# Patient Record
Sex: Female | Born: 1975 | Hispanic: No | Smoking: Never smoker
Health system: Southern US, Community
[De-identification: ages and names within clinical notes are randomized; demographics above are authoritative.]

## PROBLEM LIST (undated history)

## (undated) DIAGNOSIS — M1990 Unspecified osteoarthritis, unspecified site: Secondary | ICD-10-CM

## (undated) DIAGNOSIS — D649 Anemia, unspecified: Secondary | ICD-10-CM

## (undated) DIAGNOSIS — A630 Anogenital (venereal) warts: Secondary | ICD-10-CM

## (undated) DIAGNOSIS — L930 Discoid lupus erythematosus: Secondary | ICD-10-CM

## (undated) DIAGNOSIS — G43909 Migraine, unspecified, not intractable, without status migrainosus: Secondary | ICD-10-CM

## (undated) DIAGNOSIS — E739 Lactose intolerance, unspecified: Secondary | ICD-10-CM

## (undated) HISTORY — PX: OTHER SURGICAL HISTORY: SHX169

## (undated) HISTORY — DX: Migraine, unspecified, not intractable, without status migrainosus: G43.909

## (undated) HISTORY — DX: Anogenital (venereal) warts: A63.0

## (undated) HISTORY — DX: Discoid lupus erythematosus: L93.0

## (undated) HISTORY — DX: Lactose intolerance, unspecified: E73.9

## (undated) HISTORY — PX: FOOT SURGERY: SHX648

---

## 2003-05-03 ENCOUNTER — Other Ambulatory Visit: Admission: RE | Admit: 2003-05-03 | Discharge: 2003-05-03 | Payer: Self-pay | Admitting: Obstetrics and Gynecology

## 2004-05-22 ENCOUNTER — Other Ambulatory Visit: Admission: RE | Admit: 2004-05-22 | Discharge: 2004-05-22 | Payer: Self-pay | Admitting: Obstetrics and Gynecology

## 2005-06-16 ENCOUNTER — Other Ambulatory Visit: Admission: RE | Admit: 2005-06-16 | Discharge: 2005-06-16 | Payer: Self-pay | Admitting: *Deleted

## 2005-11-23 ENCOUNTER — Ambulatory Visit: Payer: Self-pay | Admitting: Family Medicine

## 2005-11-26 ENCOUNTER — Ambulatory Visit: Payer: Self-pay | Admitting: Family Medicine

## 2006-07-20 ENCOUNTER — Other Ambulatory Visit: Admission: RE | Admit: 2006-07-20 | Discharge: 2006-07-20 | Payer: Self-pay | Admitting: Obstetrics & Gynecology

## 2007-09-01 ENCOUNTER — Other Ambulatory Visit: Admission: RE | Admit: 2007-09-01 | Discharge: 2007-09-01 | Payer: Self-pay | Admitting: Obstetrics & Gynecology

## 2013-10-17 ENCOUNTER — Other Ambulatory Visit: Payer: Self-pay | Admitting: Certified Nurse Midwife

## 2013-10-17 NOTE — Telephone Encounter (Signed)
AEX scheduled for 12/05/13 #28/1 refill sent to last patient until AEX

## 2013-12-05 ENCOUNTER — Ambulatory Visit: Payer: Self-pay | Admitting: Certified Nurse Midwife

## 2013-12-12 ENCOUNTER — Encounter: Payer: Self-pay | Admitting: Certified Nurse Midwife

## 2013-12-14 ENCOUNTER — Ambulatory Visit (INDEPENDENT_AMBULATORY_CARE_PROVIDER_SITE_OTHER): Payer: BC Managed Care – PPO | Admitting: Certified Nurse Midwife

## 2013-12-14 ENCOUNTER — Encounter: Payer: Self-pay | Admitting: Certified Nurse Midwife

## 2013-12-14 VITALS — BP 118/70 | HR 74 | Resp 16 | Ht 65.75 in | Wt 130.0 lb

## 2013-12-14 DIAGNOSIS — Z0000 Encounter for general adult medical examination without abnormal findings: Secondary | ICD-10-CM

## 2013-12-14 DIAGNOSIS — A630 Anogenital (venereal) warts: Secondary | ICD-10-CM

## 2013-12-14 DIAGNOSIS — Z309 Encounter for contraceptive management, unspecified: Secondary | ICD-10-CM

## 2013-12-14 DIAGNOSIS — L930 Discoid lupus erythematosus: Secondary | ICD-10-CM | POA: Insufficient documentation

## 2013-12-14 DIAGNOSIS — Z01419 Encounter for gynecological examination (general) (routine) without abnormal findings: Secondary | ICD-10-CM

## 2013-12-14 LAB — TSH: TSH: 2.343 u[IU]/mL (ref 0.350–4.500)

## 2013-12-14 LAB — CBC
HEMATOCRIT: 39.1 % (ref 36.0–46.0)
Hemoglobin: 13.1 g/dL (ref 12.0–15.0)
MCH: 31.2 pg (ref 26.0–34.0)
MCHC: 33.5 g/dL (ref 30.0–36.0)
MCV: 93.1 fL (ref 78.0–100.0)
Platelets: 363 10*3/uL (ref 150–400)
RBC: 4.2 MIL/uL (ref 3.87–5.11)
RDW: 12.9 % (ref 11.5–15.5)
WBC: 9 10*3/uL (ref 4.0–10.5)

## 2013-12-14 LAB — LIPID PANEL
CHOL/HDL RATIO: 2.7 ratio
Cholesterol: 226 mg/dL — ABNORMAL HIGH (ref 0–200)
HDL: 83 mg/dL (ref >39)
LDL Cholesterol: 107 mg/dL — ABNORMAL HIGH (ref 0–99)
TRIGLYCERIDES: 181 mg/dL — AB (ref <150)
VLDL: 36 mg/dL (ref 0–40)

## 2013-12-14 MED ORDER — ETONOGESTREL-ETHINYL ESTRADIOL 0.12-0.015 MG/24HR VA RING: 1.0000 | VAGINAL_RING | Freq: Once | VAGINAL | Status: DC

## 2013-12-14 NOTE — Progress Notes (Signed)
38 y.o. G0P0000 Divorced Caucasian Fe here for annual exam. Periods heavier on 2-3 rd day to the point of soaking clothes and changing pad frequently and then decreases to normal amount the next 2 days. Patient also now has slight spotting mid cycle for 2-4 days, no tampon needed. Patient on contraception for history of menorrhagia. Lupus stable on medication, no health changes.Not sexually active. Sees specialist once yearly for labs regarding Lupus.  Patient's last menstrual period was 11/28/2013.          Sexually active: no  The current method of family planning is OCP (estrogen/progesterone).    Exercising: yes  walking Smoker:  no  Health Maintenance: Pap:  11-25-12 neg HPV HR neg MMG:  none Colonoscopy:  none BMD:   none TDaP:  2007 Labs: none Self breast exam: done monthly   reports that she has never smoked. She does not have any smokeless tobacco history on file. She reports that she does not drink alcohol or use illicit drugs.  Past Medical History  Diagnosis Date  . Genital warts   . Discoid lupus   . Migraines     no aura    Past Surgical History  Procedure Laterality Date  . Tear duct surgery      infant    Current Outpatient Prescriptions  Medication Sig Dispense Refill  . Cholecalciferol (VITAMIN D PO) Take by mouth daily.      . Cyanocobalamin (VITAMIN B 12 PO) Take by mouth daily.      . hydroxychloroquine (PLAQUENIL) 200 MG tablet Take by mouth daily.      . Multiple Vitamins-Minerals (MULTIVITAMIN PO) Take by mouth daily.      . TRI-SPRINTEC 0.18/0.215/0.25 MG-35 MCG tablet Take one tablet by mouth one time daily  28 tablet  1   No current facility-administered medications for this visit.    Family History  Problem Relation Age of Onset  . Lupus Mother   . Hypertension Mother   . Hypertension Maternal Grandmother   . Hypertension Maternal Grandfather   . Cancer Maternal Grandfather     prostate    ROS:  Pertinent items are noted in HPI.   Otherwise, a comprehensive ROS was negative.  Exam:   BP 118/70  Pulse 74  Resp 16  Ht 5' 5.75" (1.67 m)  Wt 130 lb (58.968 kg)  BMI 21.14 kg/m2  LMP 11/28/2013 Height: 5' 5.75" (167 cm)  Ht Readings from Last 3 Encounters:  12/14/13 5' 5.75" (1.67 m)    General appearance: alert, cooperative and appears stated age Head: Normocephalic, without obvious abnormality, atraumatic Neck: no adenopathy, supple, symmetrical, trachea midline and thyroid normal to inspection and palpation Lungs: clear to auscultation bilaterally Breasts: normal appearance, no masses or tenderness, No nipple retraction or dimpling, No nipple discharge or bleeding, No axillary or supraclavicular adenopathy Heart: regular rate and rhythm Abdomen: soft, non-tender; no masses,  no organomegaly Extremities: extremities normal, atraumatic, no cyanosis or edema Skin: Skin color, texture, turgor normal. No rashes or lesions Lymph nodes: Cervical, supraclavicular, and axillary nodes normal. No abnormal inguinal nodes palpated Neurologic: Grossly normal   Pelvic: External genitalia:  no lesions              Urethra:  normal appearing urethra with no masses, tenderness or lesions              Bartholin's and Skene's: normal                 Vagina:  normal appearing vagina with normal color and discharge, no lesions              Cervix: normal, non tender              Pap taken: no Bimanual Exam:  Uterus:  normal size, contour, position, consistency, mobility, non-tender and anteflexed              Adnexa: normal adnexa and no mass, fullness, tenderness               Rectovaginal: Confirms               Anus:  normal sphincter tone, small condyloma noted below fourchette on left perineal area. No other lesions noted.  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 Patient desires trial of Nuvaring. Patient inserted and removed Nuvaring without problems with Provider check.  RX Nuvaring see order Patient to keep menses record and advise after 2 months of Nuvaring use unless problems.  Discussed finding with patient. Patient requests treated with TCA again today.  Procedure: Unaffected area of perineum protected with KY jelly and TCA applied to condyloma with acetowhite effect noted. Patient tolerated well. Patient to check at home to see if resolves in 2 weeks, if not present no visit needed, if still present schedule for retreatment. Patient agreeable.  Continue follow up as indicated.  Lab: TSH,Lipid panel, CBC   Pap smear as per guidelines   pap smear not taken today counseled on STD prevention, use and side effects of OCP's, adequate intake of calcium and vitamin D, diet and exercise  return annually or prn  An After Visit Summary was printed and given to the patient.

## 2013-12-14 NOTE — Patient Instructions (Signed)
EXERCISE AND DIET:  We recommended that you start or continue a regular exercise program for good health. Regular exercise means any activity that makes your heart beat faster and makes you sweat.  We recommend exercising at least 30 minutes per day at least 3 days a week, preferably 4 or 5.  We also recommend a diet low in fat and sugar.  Inactivity, poor dietary choices and obesity can cause diabetes, heart attack, stroke, and kidney damage, among others.    ALCOHOL AND SMOKING:  Women should limit their alcohol intake to no more than 7 drinks/beers/glasses of wine (combined, not each!) per week. Moderation of alcohol intake to this level decreases your risk of breast cancer and liver damage. And of course, no recreational drugs are part of a healthy lifestyle.  And absolutely no smoking or even second hand smoke. Most people know smoking can cause heart and lung diseases, but did you know it also contributes to weakening of your bones? Aging of your skin?  Yellowing of your teeth and nails?  CALCIUM AND VITAMIN D:  Adequate intake of calcium and Vitamin D are recommended.  The recommendations for exact amounts of these supplements seem to change often, but generally speaking 600 mg of calcium (either carbonate or citrate) and 800 units of Vitamin D per day seems prudent. Certain women may benefit from higher intake of Vitamin D.  If you are among these women, your doctor will have told you during your visit.    PAP SMEARS:  Pap smears, to check for cervical cancer or precancers,  have traditionally been done yearly, although recent scientific advances have shown that most women can have pap smears less often.  However, every woman still should have a physical exam from her gynecologist every year. It will include a breast check, inspection of the vulva and vagina to check for abnormal growths or skin changes, a visual exam of the cervix, and then an exam to evaluate the size and shape of the uterus and  ovaries.  And after 40 years of age, a rectal exam is indicated to check for rectal cancers. We will also provide age appropriate advice regarding health maintenance, like when you should have certain vaccines, screening for sexually transmitted diseases, bone density testing, colonoscopy, mammograms, etc.   MAMMOGRAMS:  All women over 40 years old should have a yearly mammogram. Many facilities now offer a "3D" mammogram, which may cost around $50 extra out of pocket. If possible,  we recommend you accept the option to have the 3D mammogram performed.  It both reduces the number of women who will be called back for extra views which then turn out to be normal, and it is better than the routine mammogram at detecting truly abnormal areas.    COLONOSCOPY:  Colonoscopy to screen for colon cancer is recommended for all women at age 50.  We know, you hate the idea of the prep.  We agree, BUT, having colon cancer and not knowing it is worse!!  Colon cancer so often starts as a polyp that can be seen and removed at colonscopy, which can quite literally save your life!  And if your first colonoscopy is normal and you have no family history of colon cancer, most women don't have to have it again for 10 years.  Once every ten years, you can do something that may end up saving your life, right?  We will be happy to help you get it scheduled when you are ready.    Be sure to check your insurance coverage so you understand how much it will cost.  It may be covered as a preventative service at no cost, but you should check your particular policy.     Genital Warts Genital warts are caused by a germ (human papillomavirus, HPV). This germ is spread by having unprotected sex (intercourse) with an infected person. It can be spread by vaginal, anal, and oral sex. A person who is infected might not show any signs or problems. HOME CARE  Follow your doctor's treatment instructions.  Do not use medicine that is meant for hand  warts. Only take medicine as told by your doctor.  Tell your past and current sex partner(s) that you have genital warts. They may need treatment.  Avoid sexual contact while you are being treated.  Do not touch or scratch the warts. You could spread it to other parts of your body.  Women with genital warts should have a cervical cancer check (Pap test) at least once a year.  Tell your doctor if you become pregnant. The germ can be passed to the baby.  After treatment, use a condom during sex.  Ask your doctor before using anti-itch creams. GET HELP RIGHT AWAY IF:   Your treated skin becomes red, puffy (swollen), or painful.  You have a fever.  You feel generally sick.  You feel little lumps in and around your genital area.  You are bleeding or have pain during sex. MAKE SURE YOU:   Understand these instructions.  Will watch your condition.  Will get help right away if you are not doing well or get worse. Document Released: 01/27/2010 Document Revised: 01/25/2012 Document Reviewed: 05/11/2011 [facility] Patient Information 2014 [place], [place].

## 2013-12-14 NOTE — Progress Notes (Signed)
Reviewed personally.  M. Suzanne Jazline Cumbee, MD.  

## 2013-12-19 ENCOUNTER — Telehealth: Payer: Self-pay

## 2013-12-19 NOTE — Telephone Encounter (Signed)
[email or web address]

## 2013-12-19 NOTE — Telephone Encounter (Signed)
Message copied by Susy Manor on Tue Dec 19, 2013  3:47 PM ------      Message from: Regina Eck      Created: Fri Dec 15, 2013  5:08 PM       Notify patient that lipid panel borderline high, but HDL good cholesterol is great      Regular exercise and low cholesterol diet should  Help this stay normal      CBC and TSH normal ------

## 2013-12-19 NOTE — Telephone Encounter (Signed)
Patient notified of results.

## 2014-03-22 ENCOUNTER — Telehealth: Payer: Self-pay | Admitting: Certified Nurse Midwife

## 2014-03-22 NOTE — Telephone Encounter (Signed)
Spoke with patient. Patient states that she is calling to give Regina Eck CNM an update on how she is doing with switching over to the [place]. Patient states "Things are clearing up and seem to be better. My only concern is with my mood swings. They are more often than usual. They come a day or two before my period and I am really irritable. I have had mood swings before but not to this extreme." Patient would like to know if this is something that will get worse as she continues to take this medication or if it will eventually go away. Advised this could be a result of the body adjusting to new medication. Advised the longer she uses the Nuvaring we would not expect symptoms to increase monthly but we can not guarantee that they will decrease with time as all medications effects people differently. Patient states that she understands and would like to continue on the [place] as it is helping a lot other than mood swings. Will continue to monitor irritability and mood swings and will call back with update. If patient changes her mind or has any further questions she will give [place] a call back. Patient agreeable and verbalizes understanding.   Routing to provider for final review. Patient agreeable to disposition. Will close encounter

## 2014-03-22 NOTE — Telephone Encounter (Signed)
Patient is calling to let Debbi know how she is doing on the new medication. She said it is going better but she has a few concerns and wants to discuss them with debbi

## 2014-10-17 ENCOUNTER — Ambulatory Visit (INDEPENDENT_AMBULATORY_CARE_PROVIDER_SITE_OTHER): Payer: BC Managed Care – PPO | Admitting: Podiatry

## 2014-10-17 ENCOUNTER — Encounter: Payer: Self-pay | Admitting: Podiatry

## 2014-10-17 ENCOUNTER — Ambulatory Visit (INDEPENDENT_AMBULATORY_CARE_PROVIDER_SITE_OTHER): Payer: BC Managed Care – PPO

## 2014-10-17 VITALS — BP 133/75 | HR 87 | Resp 16 | Ht 66.0 in | Wt 125.0 lb

## 2014-10-17 DIAGNOSIS — M7751 Other enthesopathy of right foot: Secondary | ICD-10-CM

## 2014-10-17 DIAGNOSIS — M779 Enthesopathy, unspecified: Secondary | ICD-10-CM

## 2014-10-17 DIAGNOSIS — M778 Other enthesopathies, not elsewhere classified: Secondary | ICD-10-CM

## 2014-10-17 DIAGNOSIS — M19171 Post-traumatic osteoarthritis, right ankle and foot: Secondary | ICD-10-CM

## 2014-10-18 NOTE — Progress Notes (Signed)
She presents today with chief complaint of pain to the dorsal aspect of her right foot as she points to the first metatarsophalangeal joint right and a prominence to the dorsal aspect of the first metatarsal medial cuneiform joint. She states that she has been in pain for at least X months now.  Objective: Vital signs are stable she is alert and oriented 3. Pulses are strongly palpable bilateral. She does have pain on palpation and range of motion of the first metatarsophalangeal joint of the right foot which does not appear to be rigid however it does demonstrate a moderate functional hallux limitus. She also has dorsal spurring to the first metatarsal medial cuneiform joint. Radiographic evaluation does demonstrate dorsal spurring and what appears to be a prominent injury at the base of the second metatarsal which has gone on to fuse with the base of the first metatarsal and elevated and elongated first metatarsal is resulting in functional and anatomical hallux limitus with capsulitis.  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  Plan: Injected dexamethasone after sterile Betadine skin prep first metatarsophalangeal joint right foot and she was scanned for set of orthotics.

## 2014-11-26 ENCOUNTER — Ambulatory Visit (INDEPENDENT_AMBULATORY_CARE_PROVIDER_SITE_OTHER): Payer: BLUE CROSS/BLUE SHIELD | Admitting: *Deleted

## 2014-11-26 ENCOUNTER — Ambulatory Visit: Payer: Self-pay | Admitting: Podiatry

## 2014-11-26 DIAGNOSIS — M779 Enthesopathy, unspecified: Secondary | ICD-10-CM

## 2014-11-26 NOTE — Patient Instructions (Signed)

## 2014-11-26 NOTE — Progress Notes (Signed)
Orthotics  Picked up. Breakin instructions given.

## 2014-12-18 ENCOUNTER — Other Ambulatory Visit: Payer: Self-pay | Admitting: Certified Nurse Midwife

## 2014-12-18 NOTE — Telephone Encounter (Signed)
Patient has a AEX scheduled for 12/20/14 with Ms. Debbie. I called and s/w patient she said she doesn't need a refill she can wait until her AEX.

## 2014-12-20 ENCOUNTER — Encounter: Payer: Self-pay | Admitting: Certified Nurse Midwife

## 2014-12-20 ENCOUNTER — Ambulatory Visit (INDEPENDENT_AMBULATORY_CARE_PROVIDER_SITE_OTHER): Payer: BLUE CROSS/BLUE SHIELD | Admitting: Certified Nurse Midwife

## 2014-12-20 VITALS — BP 120/70 | HR 72 | Resp 16 | Ht 65.75 in | Wt 130.0 lb

## 2014-12-20 DIAGNOSIS — Z01419 Encounter for gynecological examination (general) (routine) without abnormal findings: Secondary | ICD-10-CM

## 2014-12-20 DIAGNOSIS — N920 Excessive and frequent menstruation with regular cycle: Secondary | ICD-10-CM

## 2014-12-20 DIAGNOSIS — Z124 Encounter for screening for malignant neoplasm of cervix: Secondary | ICD-10-CM

## 2014-12-20 NOTE — Progress Notes (Signed)
39 y.o. G0P0000 Divorced  Caucasian Fe here for annual exam.  Periods normal, no issues.Contraception was working well up to the past 3 months with minimal dysmenorrhea and menorrhagia, then has starting having 2 days of heavy bleeding with cramping and diarrhea. Would to manage better. Migraines have been less. Sees PCP once yearly for medication management. Lupus stable at present. No other helath issues today.  Patient's last menstrual period was 12/13/2014.          Sexually active: No.  The current method of family planning is NuvaRing vaginal inserts.    Exercising: Yes.    walking Smoker:  no  Health Maintenance: Pap:  11-25-12 neg HPV HR neg MMG:  none Colonoscopy:  none BMD:   none TDaP:  2007 Labs: none Self breast exam: done monthly   reports that she has never smoked. She does not have any smokeless tobacco history on file. She reports that she drinks alcohol. She reports that she does not use illicit drugs.  Past Medical History  Diagnosis Date  . Genital warts   . Discoid lupus   . Migraines     no aura    Past Surgical History  Procedure Laterality Date  . Tear duct surgery      infant    Current Outpatient Prescriptions  Medication Sig Dispense Refill  . Cholecalciferol (VITAMIN D PO) Take by mouth daily.    . Cyanocobalamin (VITAMIN B 12 PO) Take by mouth daily.    Marland Kitchen etonogestrel-ethinyl estradiol (NUVARING) 0.12-0.015 MG/24HR vaginal ring Place 1 each vaginally once. Insert vaginally and leave in place for 3 consecutive weeks, then remove for 1 week. 1 each 12  . hydroxychloroquine (PLAQUENIL) 200 MG tablet Take by mouth every other day.     . Multiple Vitamins-Minerals (MULTIVITAMIN PO) Take by mouth daily.     No current facility-administered medications for this visit.    Family History  Problem Relation Age of Onset  . Lupus Mother   . Hypertension Mother   . Hypertension Maternal Grandmother   . Hypertension Maternal Grandfather   . Cancer  Maternal Grandfather     prostate    ROS:  Pertinent items are noted in HPI.  Otherwise, a comprehensive ROS was negative.  Exam:   BP 120/70 mmHg  Pulse 72  Resp 16  Ht 5' 5.75" (1.67 m)  Wt 130 lb (58.968 kg)  BMI 21.14 kg/m2  LMP 12/13/2014 Height: 5' 5.75" (167 cm) Ht Readings from Last 3 Encounters:  12/20/14 5' 5.75" (1.67 m)  10/17/14 5\' 6"  (1.676 m)  12/14/13 5' 5.75" (1.67 m)    General appearance: alert, cooperative and appears stated age Head: Normocephalic, without obvious abnormality, atraumatic Neck: no adenopathy, supple, symmetrical, trachea midline and thyroid normal to inspection and palpation Lungs: clear to auscultation bilaterally Breasts: normal appearance, no masses or tenderness, No nipple retraction or dimpling, No nipple discharge or bleeding, No axillary or supraclavicular adenopathy Heart: regular rate and rhythm Abdomen: soft, non-tender; no masses,  no organomegaly Extremities: extremities normal, atraumatic, no cyanosis or edema Skin: Skin color, texture, turgor normal. No rashes or lesions Lymph nodes: Cervical, supraclavicular, and axillary nodes normal. No abnormal inguinal nodes palpated Neurologic: Grossly normal   Pelvic: External genitalia:  no lesions              Urethra:  normal appearing urethra with no masses, tenderness or lesions              Bartholin's and Skene's:  normal                 Vagina: normal appearing vagina with normal color and discharge, no lesions Nuvaring present              Cervix: normal,non tender, no lesions              Pap taken: Yes.   Bimanual Exam:  Uterus:  normal size, contour, position, consistency, mobility, non-tender              Adnexa: normal adnexa and no mass, fullness, tenderness               Rectovaginal: Confirms               Anus:  normal sphincter tone, no lesions  Chaperone present: Yes  A:  Well Woman with normal exam  Contraception Nuvaring  desired  Menorrhagia/Dysmennorrhea    P:   Reviewed health and wellness pertinent to exam  Rx Nuvaring see order, discussed IUD use if continues to have periods. Suggested using continuous Nuvaring and period every 3 months to see if resolves. Patient wants to try. Will also check TSH which can effect cycles. Patient will advise if continues with problem.  Pap smear taken today with HPV reflex  counseled on breast self exam, mammography screening given information to schedule her first mammogram in 8/16, adequate intake of calcium and vitamin D, diet and exercise  return annually or prn  An After Visit Summary was printed and given to the patient.

## 2014-12-20 NOTE — Patient Instructions (Signed)

## 2014-12-21 LAB — TSH: TSH: 2.256 u[IU]/mL (ref 0.350–4.500)

## 2014-12-22 ENCOUNTER — Other Ambulatory Visit: Payer: Self-pay | Admitting: Certified Nurse Midwife

## 2014-12-24 ENCOUNTER — Ambulatory Visit: Payer: BLUE CROSS/BLUE SHIELD | Admitting: Podiatry

## 2014-12-24 LAB — IPS PAP TEST WITH REFLEX TO HPV

## 2014-12-24 MED ORDER — ETONOGESTREL-ETHINYL ESTRADIOL 0.12-0.015 MG/24HR VA RING: VAGINAL_RING | VAGINAL | Status: DC

## 2014-12-24 NOTE — Telephone Encounter (Signed)
Floragem 3 is the probiotic and she needs to ask pharmacist for it. It is not RX, but kept in refrigerated case in pharmacy. Nuvaring is what she is to continue with as we discussed. I gave her instructions how to do continuous and period every 3 months.

## 2014-12-24 NOTE — Telephone Encounter (Signed)
Message left to return call to Kathleen Barron at [phone].    

## 2014-12-24 NOTE — Addendum Note (Signed)
Addended by: Regina Eck on: 12/24/2014 01:05 PM   Modules accepted: Miquel Dunn

## 2014-12-24 NOTE — Telephone Encounter (Signed)
Correction to RX for continuous Nuvaring per Debbi's order.

## 2014-12-24 NOTE — Telephone Encounter (Signed)
Debbi, can you review?   I think patient may need a different rx if instructed to use continuously at Visit.   Also, probiotics instructions?

## 2014-12-24 NOTE — Telephone Encounter (Signed)
Patient returned call. Message from Regina Eck CNM given. Advised new rx placed at [facility]. Patient agreeable.  Encounter closed prior.

## 2014-12-24 NOTE — Addendum Note (Signed)
Addended by: Jaymes Graff on: 12/24/2014 01:44 PM   Modules accepted: Orders

## 2014-12-24 NOTE — Telephone Encounter (Signed)
Pt checking on Nuvaring Rx that should have been sent to [facility] pharmacy and also the information on refrigerated probiotics that Ms Jackelyn Poling was going to give her.

## 2014-12-24 NOTE — Progress Notes (Signed)
Reviewed personally.  M. Suzanne Cevin Rubinstein, MD.  

## 2015-02-26 ENCOUNTER — Telehealth: Payer: Self-pay | Admitting: Certified Nurse Midwife

## 2015-02-26 MED ORDER — ETONOGESTREL-ETHINYL ESTRADIOL 0.12-0.015 MG/24HR VA RING: VAGINAL_RING | VAGINAL | Status: DC

## 2015-02-26 NOTE — Telephone Encounter (Signed)
Patient was told to call with a update today. Last seen 12/20/14.

## 2015-02-26 NOTE — Telephone Encounter (Signed)
I feel the continuous use after she has been on another 3 month should stop the spotting, especially since her cycle was better. If she wants to continue can change Rx to continuous use.

## 2015-02-26 NOTE — Telephone Encounter (Signed)
Spoke with patient. Advised of message as seen below from [place]. Patient is agreeable to continue with Nuvaring at this time. Rx for Nuvaring changed to continuous use and sent to [facility] pharmacy on file. Patient is agreeable.  Routing to provider for final review. Patient agreeable to disposition. Will close encounter

## 2015-02-26 NOTE — Telephone Encounter (Signed)
Spoke with patient. Patient is calling to provide update on how she is doing with using Nuvaring continuously. Patient used Nuvaring continuously for three months. Is currently in ring free week. Due to replace new ring today. "I was doing better and then the entire third month I was spotting which was concerning because I could not really tell when my cycle was. Also, for me to use it continuously if she wants me to continue I need the prescription to state that so I can fill it on time every month." Advised patient will provide Regina Eck CNM with an update and return call with further recommendations. Patient is agreeable.

## 2015-02-26 NOTE — Telephone Encounter (Signed)
Left message to call Kaitlyn at [phone]. 

## 2015-04-29 ENCOUNTER — Telehealth: Payer: Self-pay | Admitting: Certified Nurse Midwife

## 2015-04-29 NOTE — Telephone Encounter (Signed)
Have patient go ahead and remove Nuvaring and finish this period and insert Nuvaring on 6/15. Use BUM through rest of month. Wear for 2 month continuous and then remove as usual. If continues spotting needs OV.

## 2015-04-29 NOTE — Telephone Encounter (Signed)
Spoke with patient. Advised of message as seen below from [place]. Patient is agreeable and verbalizes understanding.  Routing to provider for final review. Patient agreeable to disposition. Will close encounter.

## 2015-04-29 NOTE — Telephone Encounter (Signed)
Spoke with patient. Patient states that she was previously on Tri-Sprintec was having very heavy cycles. Switched to the [place] with removal and replacement monthly. With monthly removal and replacement of Nuvaring patient began to have increased spotting was switched to continuous 3 month use. During first three months patient did well until one week before she was due to remove ring and began to have spotting. Was advised to use again for 3 more months as body may be adjusting to using Nuvaring continuously for 3 months. Patient is not on her sixth month and is due to remove her ring on 6/14. Patient started having bleeding in 6/9. "It is worse than last time. Now I have no clue when I will have bleeding. Some days it is light spotting and others its like a normal period. Now I am due to start my period again this week but have been bleeding since last week." Patient requesting further recommendations with Nuvaring. Advised will need to speak with Regina Eck CNM and return call. Patient is agreeable.

## 2015-04-29 NOTE — Telephone Encounter (Signed)
Patient is having some "frustating issues with my period and birth control". Last seen 12/20/2014.

## 2015-08-12 ENCOUNTER — Telehealth: Payer: Self-pay | Admitting: Certified Nurse Midwife

## 2015-08-12 NOTE — Telephone Encounter (Signed)
Patient is having some " issues with her birth control". Lasts seen 12/20/14.

## 2015-08-12 NOTE — Telephone Encounter (Signed)
Spoke with patient. Patient states that Friday 9/23 she began to have increased bleeding and cramping. States she bled through one overnight pad in one hour. "I was having extreme cramping and felt like I was going to throw up. I was having chills and got pale." On Saturday bleeding decreased but was still having large clots. Sunday her bleeding decreased with cramping and diarrhea. Today her bleeding have decreased further has only changed her pad once since waking up. Denies any diarrhea today. Is due to replace her Nuvaring tomorrow. States she has a constant pressure in her left side as well. Friday had intermittent sharp pain in her left side. Denies any sharp pain today. Denies any feelings of fatigue, weakness, dizziness, of shortness of breath. Offered appointment today with another provider but patient declines. Would like see Melvia Heaps CNM tomorrow. Appointment scheduled for 9/27 at 8:30 am. Aware to monitor symptoms and if they increase she will need to be seen earlier.   Routing to provider for final review. Patient agreeable to disposition. Will close encounter.

## 2015-08-13 ENCOUNTER — Ambulatory Visit (INDEPENDENT_AMBULATORY_CARE_PROVIDER_SITE_OTHER): Payer: BLUE CROSS/BLUE SHIELD | Admitting: Certified Nurse Midwife

## 2015-08-13 VITALS — BP 120/70 | HR 64 | Resp 18 | Ht 65.75 in | Wt 127.0 lb

## 2015-08-13 DIAGNOSIS — N920 Excessive and frequent menstruation with regular cycle: Secondary | ICD-10-CM | POA: Diagnosis not present

## 2015-08-13 DIAGNOSIS — N852 Hypertrophy of uterus: Secondary | ICD-10-CM | POA: Diagnosis not present

## 2015-08-13 DIAGNOSIS — N938 Other specified abnormal uterine and vaginal bleeding: Secondary | ICD-10-CM | POA: Diagnosis not present

## 2015-08-13 LAB — CBC
HEMATOCRIT: 38.9 % (ref 36.0–46.0)
Hemoglobin: 13.3 g/dL (ref 12.0–15.0)
MCH: 32 pg (ref 26.0–34.0)
MCHC: 34.2 g/dL (ref 30.0–36.0)
MCV: 93.7 fL (ref 78.0–100.0)
MPV: 10 fL (ref 8.6–12.4)
PLATELETS: 312 10*3/uL (ref 150–400)
RBC: 4.15 MIL/uL (ref 3.87–5.11)
RDW: 13.2 % (ref 11.5–15.5)
WBC: 6.8 10*3/uL (ref 4.0–10.5)

## 2015-08-13 NOTE — Progress Notes (Signed)
39 y.o. Married Caucasian [id number] for evaluation of contraception Nuvaring for the past 2 years. Started on for dysmenorrhea/menorrhagia and contraception. Patient has tried continuous use with 3 month use of Nuvaring which worked well until the last 6 months, then switched to  2  months continous which had been working..  Menses duration 7 days with heavy to moderate flow and then spotting for 6 days.. Patient taking medication as prescribed. Denies missed Nuvaring, headaches, nausea, DVT warning signs.Keeping menses calendar. 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 Feeling OK. Patient does not plan to have any children and is ready to be done with these heavy periods. No other health issues today. Hgb.12.8  O: Healthy female, WD WN Affect: normal orientation X 3   Exam: Skin warm and dry Abdomen: soft, non tender,no masses External genital: normal female,no lesions BUS: normal Urethral meatus normal Vagina: small amount of red blood noted in vagina Cervix: normal appearance,non tender, no lesions Uterus: enlarged 12 week size, non tender Adnexa: normal, non tender, no masses or fullness   A:Normal pelvic exam Enlarged uterus History of dysmenorrhea with menorrhagia with previous good control with Nuvaring monthly and then continuous use, but now having DUB again. Enlarged Uterus Discoid Lupus, no medication now  P: Discussed pelvic exam and enlarged uterus finding and need for evaluation with PUS, which would recommend with DUB occurrence also. Patient agreeable. Patient aware she will be called with insurance info and scheduled. Also discussed possible IUD, ablation for control of menorrhagia, but will not provide contraception. Instructed to go ahead and put Nuvaring to see if spotting will stop and also do Motin 800mg  every 8 hours for 24 hours. Will advise if bleeding does not stop.  Rv prn , as above

## 2015-08-13 NOTE — Patient Instructions (Signed)
Dysfunctional Uterine Bleeding Normally, menstrual periods begin between ages 11 to 17 in young women. A normal menstrual cycle/period may begin every 23 days up to 35 days and lasts from 1 to 7 days. Around 12 to 14 days before your menstrual period starts, ovulation (ovary produces an egg) occurs. When counting the time between menstrual periods, count from the first day of bleeding of the previous period to the first day of bleeding of the next period. Dysfunctional (abnormal) uterine bleeding is bleeding that is different from a normal menstrual period. Your periods may come earlier or later than usual. They may be lighter, have blood clots or be heavier. You may have bleeding between periods, or you may skip one period or more. You may have bleeding after sexual intercourse, bleeding after menopause, or no menstrual period. CAUSES   Pregnancy (normal, miscarriage, tubal).  IUDs (intrauterine device, birth control).  Birth control pills.  Hormone treatment.  Menopause.  Infection of the cervix.  Blood clotting problems.  Infection of the inside lining of the uterus.  Endometriosis, inside lining of the uterus growing in the pelvis and other female organs.  Adhesions (scar tissue) inside the uterus.  Obesity or severe weight loss.  Uterine polyps inside the uterus.  Cancer of the vagina, cervix, or uterus.  Ovarian cysts or polycystic ovary syndrome.  Medical problems (diabetes, thyroid disease).  Uterine fibroids (noncancerous tumor).  Problems with your female hormones.  Endometrial hyperplasia, very thick lining and enlarged cells inside of the uterus.  Medicines that interfere with ovulation.  Radiation to the pelvis or abdomen.  Chemotherapy. DIAGNOSIS   Your doctor will discuss the history of your menstrual periods, medicines you are taking, changes in your weight, stress in your life, and any medical problems you may have.  Your doctor will do a physical  and pelvic examination.  Your doctor may want to perform certain tests to make a diagnosis, such as:  Pap test.  Blood tests.  Cultures for infection.  CT scan.  Ultrasound.  Hysteroscopy.  Laparoscopy.  MRI.  Hysterosalpingography.  D and C.  Endometrial biopsy. TREATMENT  Treatment will depend on the cause of the dysfunctional uterine bleeding (DUB). Treatment may include:  Observing your menstrual periods for a couple of months.  Prescribing medicines for medical problems, including:  Antibiotics.  Hormones.  Birth control pills.  Removing an IUD (intrauterine device, birth control).  Surgery:  D and C (scrape and remove tissue from inside the uterus).  Laparoscopy (examine inside the abdomen with a lighted tube).  Uterine ablation (destroy lining of the uterus with electrical current, laser, heat, or freezing).  Hysteroscopy (examine cervix and uterus with a lighted tube).  Hysterectomy (remove the uterus). HOME CARE INSTRUCTIONS   If medicines were prescribed, take exactly as directed. Do not change or switch medicines without consulting your caregiver.  Long term heavy bleeding may result in iron deficiency. Your caregiver may have prescribed iron pills. They help replace the iron that your body lost from heavy bleeding. Take exactly as directed.  Do not take aspirin or medicines that contain aspirin one week before or during your menstrual period. Aspirin may make the bleeding worse.  If you need to change your sanitary pad or tampon more than once every 2 hours, stay in bed with your feet elevated and a cold pack on your lower abdomen. Rest as much as possible, until the bleeding stops or slows down.  Eat well-balanced meals. Eat foods high in iron. Examples   are:  Leafy green vegetables.  Whole-grain breads and cereals.  Eggs.  Meat.  Liver.  Do not try to lose weight until the abnormal bleeding has stopped and your blood iron level is  back to normal. Do not lift more than ten pounds or do strenuous activities when you are bleeding.  For a couple of months, make note on your calendar, marking the start and ending of your period, and the type of bleeding (light, medium, heavy, spotting, clots or missed periods). This is for your caregiver to better evaluate your problem. SEEK MEDICAL CARE IF:   You develop nausea (feeling sick to your stomach) and vomiting, dizziness, or diarrhea while you are taking your medicine.  You are getting lightheaded or weak.  You have any problems that may be related to the medicine you are taking.  You develop pain with your DUB.  You want to remove your IUD.  You want to stop or change your birth control pills or hormones.  You have any type of abnormal bleeding mentioned above.  You are over 16 years old and have not had a menstrual period yet.  You are 39 years old and you are still having menstrual periods.  You have any of the symptoms mentioned above.  You develop a rash. SEEK IMMEDIATE MEDICAL CARE IF:   An oral temperature above 102 F (38.9 C) develops.  You develop chills.  You are changing your sanitary pad or tampon more than once an hour.  You develop abdominal pain.  You pass out or faint. Document Released: 10/30/2000 Document Revised: 01/25/2012 Document Reviewed: 10/01/2009 ExitCare Patient Information 2015 [employer]. This information is not intended to replace advice given to you by your health care Tysheka Fanguy. Make sure you discuss any questions you have with your health care Coron Rossano.  

## 2015-08-15 NOTE — Progress Notes (Signed)
Encounter reviewed Jill Jertson, MD   

## 2015-08-20 ENCOUNTER — Telehealth: Payer: Self-pay | Admitting: Obstetrics & Gynecology

## 2015-08-20 NOTE — Telephone Encounter (Signed)
Spoke with patient and reviewed benefit for pelvic ultrasound. Patient understands and agreeable. Patient agreeable to schedule 08/22/15 with Dr Sabra Heck. Patient agreeable to 72 hour cancellation policy and $431 fee. Due to scheduling within this window, patient aware she may call today only to cancel to avoid fee. Patient agreeable. Reviewed arrival date/time. Patient agreeable. Ok to close.

## 2015-08-22 ENCOUNTER — Ambulatory Visit (INDEPENDENT_AMBULATORY_CARE_PROVIDER_SITE_OTHER): Payer: BLUE CROSS/BLUE SHIELD | Admitting: Obstetrics & Gynecology

## 2015-08-22 ENCOUNTER — Ambulatory Visit (INDEPENDENT_AMBULATORY_CARE_PROVIDER_SITE_OTHER): Payer: BLUE CROSS/BLUE SHIELD

## 2015-08-22 VITALS — BP 108/78 | HR 68 | Resp 16 | Wt 129.0 lb

## 2015-08-22 DIAGNOSIS — N852 Hypertrophy of uterus: Secondary | ICD-10-CM

## 2015-08-22 DIAGNOSIS — N938 Other specified abnormal uterine and vaginal bleeding: Secondary | ICD-10-CM | POA: Diagnosis not present

## 2015-08-22 DIAGNOSIS — D250 Submucous leiomyoma of uterus: Secondary | ICD-10-CM

## 2015-08-22 NOTE — Progress Notes (Signed)
39 y.o. G0 Divorcedfemale here for a pelvic ultrasound due to DUB with Nuvaring.  She started on this about two years ago and although it has helped with her menorrhagia, she is having a fair amount of irregular bleeding.  She did try the Nuva ring continuously and this helped for a few months but she started having breakthrough bleeding.  Pt states "if you tell me nothing is wrong I am going to feel crazy".  Also feels like she is having some mild pelvic pressure and increased urinary frequency.  No back pain or actual pelvic pain.      Patient's last menstrual period was 08/01/2015.  Sexually active:  no  Contraception: Nuva ring  FINDINGS: UTERUS: 8.4 x 7.2 x 5.8cm with 4.4cm, 2.5cm, and 2.2cm fibroids EMS: 4.53mm, distorted due to presence of fibroids ADNEXA:   Left ovary 1.4 x 1.4 x 1.4cm   Right ovary 2.2 x 1.3 x 1.8cm.  (significant bowel gas present today) CUL DE SAC: no free fluid  Findings discussed with pt.  She is quite relieved that there is actually a finding.  Although she is tearful as well as she is bothered enough by her issues that she feels she is going to have to have something done.  Treatment options for fibroids discussed including myomectomy, [place], endometrial ablation (do not feel this will be very successful due to size of fibroids), IUD use, Depo Lupron use, and hysterectomy.  She has many questions and these were all addressed--how surgical options are completed, hospital stays, recovery, timing of surgery.    Pt wants to consider options and wants to talk with HR at work but want information (which was provided with ACOG bulletins on fibroids and hysterectomy) and she will call back.  All questions answered.  Assessment:  DUB on Nuva ring, Intramural uterine fibroids Plan: Pt will plan to call back when she decides.  She is most interested in hysterectomy with bilateral salpingectomy.  Would attempt this robotically due to size of uterus and nulliparity but TAH may need  to be performed due to size of uterus depending when pt decides to proceed.  Pt will continue Nuva ring for now.  ~30 minutes spent with patient >50% of time was in face to face discussion of above.

## 2015-08-28 ENCOUNTER — Encounter: Payer: Self-pay | Admitting: Obstetrics & Gynecology

## 2015-08-28 ENCOUNTER — Telehealth: Payer: Self-pay | Admitting: *Deleted

## 2015-08-28 DIAGNOSIS — D250 Submucous leiomyoma of uterus: Secondary | ICD-10-CM | POA: Insufficient documentation

## 2015-08-28 DIAGNOSIS — N938 Other specified abnormal uterine and vaginal bleeding: Secondary | ICD-10-CM | POA: Insufficient documentation

## 2015-08-28 DIAGNOSIS — N852 Hypertrophy of uterus: Secondary | ICD-10-CM | POA: Insufficient documentation

## 2015-08-28 NOTE — Telephone Encounter (Signed)
Patient called returning kelly's call? Also has some additional questions she would like to ask Dr. Sabra Heck. Best contact # [phone]

## 2015-08-28 NOTE — Telephone Encounter (Signed)
Return call to patient. Discussed surgery date options and scheduling policies.  Patient will have new insurance on 09-17-15 and does not have new policy information yet. Would like to plan for surgery in January, possibly 11-25-15.  Basic surgery questions answered, regarding hospital, LOS, estimate from hospital.  Patient will call back with insurance information when she receives it. Surgery information sheet mailed to patient.  Routing to provider for final review. Patient agreeable to disposition. Will close encounter.

## 2015-08-28 NOTE — Telephone Encounter (Signed)
Call to patient to discuss surgery. Left message to call back.

## 2015-09-04 ENCOUNTER — Telehealth: Payer: Self-pay | Admitting: Obstetrics & Gynecology

## 2015-09-04 NOTE — Telephone Encounter (Signed)
Patient has additional questions concerning surgery and gave her insurance information. She will have [employer] starting Nov 1st.

## 2015-09-05 NOTE — Telephone Encounter (Signed)
Return call to patient. She has received new policy number for [place] healthcare and request to begin precertification. Scheduling questions answered. Patient will let me know when ready to proceed. Routing insurance information to business office.

## 2015-09-19 NOTE — Telephone Encounter (Signed)
Patient returning Becky's call. She is also requesting the surgery codes so she can contact her insurance company about expected out of pocket costs for the hospital. Patient is aware Jacqlyn Larsen is not in the office today and said, "It's not urgent. I am okay to wait until she returns."

## 2015-09-20 NOTE — Telephone Encounter (Signed)
Call to patient. She would like to proceed with surgery on 11-25-15.  Surgery instruction sheet reviewed and printed copy mailed to patient. See scanned copy. Patient will fax FMLA forms to office. Will plan to start with 6 weeks out of work. Last annual exam 12-2014. Advised Dr Sabra Heck will instruct her during post-op period when to see her back for annual exam.   Routing to provider for final review. Patient agreeable to disposition. Will close encounter.

## 2015-09-20 NOTE — Telephone Encounter (Signed)
Patient calling to schedule surgery. Deposit paid.

## 2015-09-20 NOTE — Telephone Encounter (Signed)
Spoke with patient regarding surgery benefits/codes. Patient understands and is agreeable. Patient understands professional benefit only. Patient understands and agreeable to deposit/payment policy. Patient would like to contact hospital to inquire about costs and call early next week to provide deposit and move forward with scheduling. Patient is still interested in January date.  Cc: Gay Filler for status update

## 2015-09-24 ENCOUNTER — Other Ambulatory Visit: Payer: Self-pay | Admitting: Obstetrics & Gynecology

## 2015-10-07 DIAGNOSIS — Z0289 Encounter for other administrative examinations: Secondary | ICD-10-CM

## 2015-10-18 ENCOUNTER — Telehealth: Payer: Self-pay | Admitting: Obstetrics & Gynecology

## 2015-10-18 NOTE — Telephone Encounter (Signed)
Called patient and left a message for her to call back to reschedule her 4-5 week post op appointment for 12/22/14 with Dr. Sabra Heck.

## 2015-10-21 ENCOUNTER — Ambulatory Visit (INDEPENDENT_AMBULATORY_CARE_PROVIDER_SITE_OTHER): Payer: 59 | Admitting: Podiatry

## 2015-10-21 ENCOUNTER — Ambulatory Visit (INDEPENDENT_AMBULATORY_CARE_PROVIDER_SITE_OTHER): Payer: 59

## 2015-10-21 ENCOUNTER — Encounter: Payer: Self-pay | Admitting: Podiatry

## 2015-10-21 VITALS — BP 127/81 | HR 85 | Resp 16

## 2015-10-21 DIAGNOSIS — M205X1 Other deformities of toe(s) (acquired), right foot: Secondary | ICD-10-CM | POA: Diagnosis not present

## 2015-10-21 DIAGNOSIS — M19071 Primary osteoarthritis, right ankle and foot: Secondary | ICD-10-CM

## 2015-10-21 NOTE — Progress Notes (Signed)
She presents today after having not seen her for a year with a chief complaint of worsening pain to the first metatarsal and the medial aspect of the right foot. She states that the bump seems to be getting worse and even the orthotics still not seem to be helping with the foot.  Objective: Vital signs are stable she's alert and oriented 3. Pulses are strongly palpable she has a large nonpulsatile mass around the first metatarsal medial cuneiform joint which appears to be pressing upon the medial dorsal continuous nerve as well as the deep peroneal nerve. Radiographs do in a straight what appears to be osteoarthritic changes of this joint and there is pain on range of motion of this joint on physical exam.  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 Cannot rule out nerve entrapment.  Plan: I'm requesting an MRI to evaluate the joint and the tarsometatarsal joints in general. This is for surgical consideration and I will follow-up with her in the near future.

## 2015-10-22 ENCOUNTER — Telehealth: Payer: Self-pay | Admitting: *Deleted

## 2015-10-22 NOTE — Telephone Encounter (Addendum)
Orders for MRI and pt data faxed.  [employer] PRIOR AUTHORIZATION FOR MRI 29562 RIGHT FOOT, DX G58.9 [phone], VALID 45 DAYS UNTIL 12/13/2015.  FAXED TO [facility].

## 2015-10-24 NOTE — Telephone Encounter (Signed)
Pre-certification required for MRI right foot Ceritfication process was started and clinical decision will be made within 2 business days.

## 2015-10-28 ENCOUNTER — Ambulatory Visit (INDEPENDENT_AMBULATORY_CARE_PROVIDER_SITE_OTHER): Payer: 59 | Admitting: Obstetrics & Gynecology

## 2015-10-28 ENCOUNTER — Encounter: Payer: Self-pay | Admitting: Obstetrics & Gynecology

## 2015-10-28 VITALS — BP 130/78 | HR 88 | Resp 14 | Ht 65.75 in | Wt 129.0 lb

## 2015-10-28 DIAGNOSIS — N852 Hypertrophy of uterus: Secondary | ICD-10-CM

## 2015-10-28 DIAGNOSIS — N938 Other specified abnormal uterine and vaginal bleeding: Secondary | ICD-10-CM | POA: Diagnosis not present

## 2015-10-28 DIAGNOSIS — D250 Submucous leiomyoma of uterus: Secondary | ICD-10-CM | POA: Diagnosis not present

## 2015-10-28 DIAGNOSIS — L930 Discoid lupus erythematosus: Secondary | ICD-10-CM

## 2015-10-28 DIAGNOSIS — M359 Systemic involvement of connective tissue, unspecified: Secondary | ICD-10-CM | POA: Insufficient documentation

## 2015-10-28 MED ORDER — OXYCODONE-ACETAMINOPHEN 5-325 MG PO TABS: 1.0000 | ORAL_TABLET | ORAL | Status: DC | PRN

## 2015-10-28 MED ORDER — IBUPROFEN 800 MG PO TABS: 800.0000 mg | ORAL_TABLET | Freq: Three times a day (TID) | ORAL | Status: DC | PRN

## 2015-10-28 NOTE — Progress Notes (Signed)
39 y.o. G0P0000 DivorcedCaucasian female here for discussion of treatment plans for enlarged fibroid uterus.  Pt was seen in October for PUS due to DUB on Nuva ring and increased pelvic pressure and urinary frequency.  Ultrasound showed uterus 8.4 x 7.2 x 5.8cm with multiple fibroids measuring 2.2-4.4cm.  Pt was very tearful that day and had not decided about surgery.  She called back about a week later indicating that she wanted to go ahead and be scheduled for January, although she and I had not definitively discussed routes for procedure, risks, benefits, recovery.  She has decided that she wants a hysterectomy so we discussed TAH, LAVH, TLH, and different routes for removal of the uterus and fibroids.  Mechanical vaginal morcellation as well as possibly through umbilical incision but in a bag discussed.  Risks of occult and undiagnosed malignancy discussed.  Pt would like minimally invasive surgery if possible.    Salpingectomy and decreased risks of ovarian cancer reviewed.  Based upon her age, I would highly recommend ovaries stay intact. She is comfortable with this plan as well.      Procedure--TLH with bilateral salpingectomy, possible BSO, cystoscopy discussed with patient.  Hospital stay, recovery and pain management all discussed.  Risks discussed including but not limited to bleeding, 1% risk of receiving a  transfusion, infection, 3-4% risk of bowel/bladder/ureteral/vascular injury discussed as well as possible need for additional surgery if injury does occur discussed.  DVT/PE and rare risk of death discussed.  My actual complications with prior surgeries discussed.  Vaginal cuff dehiscence discussed.  Hernia formation discussed.  Positioning and incision locations discussed.  Patient aware if pathology abnormal she may need additional treatment.  All questions answered.    Ob Hx:   Patient's last menstrual period was 10/03/2015 (approximate).          Sexually active: No. Birth control:  NuvaRing vaginal inserts Last pap: 12/20/14 ASC-US. HR HPV: Neg Last MMG: Never Tobacco: No  Past Surgical History  Procedure Laterality Date  . Tear duct surgery      infant    Past Medical History  Diagnosis Date  . Genital warts   . Discoid lupus   . Migraines     no aura  . Lactose intolerance    Allergies: Lactose intolerance (gi); Penicillins; and Sulfa antibiotics  Current Outpatient Prescriptions  Medication Sig Dispense Refill  . b complex vitamins tablet Take 1 tablet by mouth daily.    . Biotin 1 MG CAPS Take by mouth daily.    Marland Kitchen etonogestrel-ethinyl estradiol (NUVARING) 0.12-0.015 MG/24HR vaginal ring Insert vaginally for 3 weeks and then replace with new ring.  Use continuous active ring for 3 months. 6 each 3  . Multiple Vitamins-Minerals (MULTIVITAMIN PO) Take by mouth daily.    . Probiotic Product (PROBIOTIC ADVANCED PO) Take by mouth daily.    . Vitamin D, Cholecalciferol, 1000 UNITS CAPS Take by mouth daily.     No current facility-administered medications for this visit.    ROS: A comprehensive review of systems was negative.  Exam:    BP 130/78 mmHg  Pulse 88  Resp 14  Ht 5' 5.75" (1.67 m)  Wt 129 lb (58.514 kg)  BMI 20.98 kg/m2  LMP 10/03/2015 (Approximate)  General appearance: alert and cooperative Head: Normocephalic, without obvious abnormality, atraumatic Neck: no adenopathy, supple, symmetrical, trachea midline and thyroid not enlarged, symmetric, no tenderness/mass/nodules Lungs: clear to auscultation bilaterally Heart: regular rate and rhythm, S1, S2 normal, no murmur, click, rub  or gallop Abdomen: soft, non-tender; bowel sounds normal; no masses,  no organomegaly Extremities: extremities normal, atraumatic, no cyanosis or edema Skin: Skin color, texture, turgor normal. No rashes or lesions Lymph nodes: Cervical, supraclavicular, and axillary nodes normal. no inguinal nodes palpated Neurologic: Grossly normal  Pelvic: External  genitalia:  no lesions              Urethra: normal appearing urethra with no masses, tenderness or lesions              Bartholins and Skenes: normal                 Vagina: normal appearing vagina with normal color and discharge, no lesions              Cervix: normal appearance              Pap taken: No.        Bimanual Exam:  Uterus:  uterus is normal size, shape, consistency and nontender, enlarged to 10  week's size, globular but mobile                                      Adnexa:    normal adnexa in size, nontender and no masses                                      Rectovaginal: Deferred                                      Anus:  defer exam  A: Enlarged uterus with fibroids Pelvic pressure Increased urinary frequency Discoid lupus, on not medication at this time  P:  Total laparoscopic hysterectomy with bilateral salpingectomy, possible BSO, cystoscopy planned Rx for Motrin and Percocet given. Medications/Vitamins reviewed.  Pt will continue to use Nuva ring and will not remove for surgery unless the timing makes this appropriate.  Also, she know to not use any ASA products for 5-7 days before surgery.   Hysterectomy brochure given for pre and post op instructions.  ~30 minutes spent with patient >50% of time was in face to face discussion of above.

## 2015-10-30 ENCOUNTER — Ambulatory Visit: Admission: RE | Admit: 2015-10-30 | Payer: 59 | Source: Ambulatory Visit

## 2015-11-02 ENCOUNTER — Encounter: Payer: Self-pay | Admitting: Obstetrics & Gynecology

## 2015-11-14 ENCOUNTER — Encounter (HOSPITAL_COMMUNITY): Payer: Self-pay

## 2015-11-14 ENCOUNTER — Encounter (HOSPITAL_COMMUNITY)
Admission: RE | Admit: 2015-11-14 | Discharge: 2015-11-14 | Disposition: A | Discharge status: Home / Self Care | Payer: 59 | Source: Ambulatory Visit | Attending: Obstetrics & Gynecology | Admitting: Obstetrics & Gynecology

## 2015-11-14 DIAGNOSIS — N938 Other specified abnormal uterine and vaginal bleeding: Secondary | ICD-10-CM | POA: Diagnosis not present

## 2015-11-14 DIAGNOSIS — Z01818 Encounter for other preprocedural examination: Secondary | ICD-10-CM | POA: Insufficient documentation

## 2015-11-14 DIAGNOSIS — D259 Leiomyoma of uterus, unspecified: Secondary | ICD-10-CM | POA: Insufficient documentation

## 2015-11-14 HISTORY — DX: Anemia, unspecified: D64.9

## 2015-11-14 HISTORY — DX: Unspecified osteoarthritis, unspecified site: M19.90

## 2015-11-14 LAB — CBC
HEMATOCRIT: 40.9 % (ref 36.0–46.0)
Hemoglobin: 13.7 g/dL (ref 12.0–15.0)
MCH: 31.4 pg (ref 26.0–34.0)
MCHC: 33.5 g/dL (ref 30.0–36.0)
MCV: 93.8 fL (ref 78.0–100.0)
Platelets: 294 10*3/uL (ref 150–400)
RBC: 4.36 MIL/uL (ref 3.87–5.11)
RDW: 12.5 % (ref 11.5–15.5)
WBC: 10.3 10*3/uL (ref 4.0–10.5)

## 2015-11-14 NOTE — Patient Instructions (Signed)
Your procedure is scheduled on: November 25, 2015    Enter through the Main Entrance of [facility] at: 6:00 am   Pick up the phone at the desk and dial [phone].  Call this number if you have problems the morning of surgery: [phone].  Remember: Do NOT eat food: after midnight on Sunday night  Do NOT drink clear liquids after: midnight on Sunday night  Take these medicines the morning of surgery with a SIP OF WATER: none   Do NOT wear jewelry (body piercing), metal hair clips/bobby pins, make-up, or nail polish. Do NOT wear lotions, powders, or perfumes.  You may wear deoderant. Do NOT shave for 48 hours prior to surgery. Do NOT bring valuables to the hospital. Contacts, dentures, or bridgework may not be worn into surgery. Leave suitcase in car.  After surgery it may be brought to your room.  For patients admitted to the hospital, checkout time is 11:00 AM the day of discharge.

## 2015-11-22 ENCOUNTER — Telehealth: Payer: Self-pay | Admitting: Obstetrics & Gynecology

## 2015-11-22 ENCOUNTER — Telehealth: Payer: Self-pay | Admitting: *Deleted

## 2015-11-22 NOTE — Telephone Encounter (Signed)
Spoke with patient. Advised patient that I spoke with Dr.Miller and she will contact the patient directly Sunday morning to discuss the inclement weather. At that time a decision will be made about her surgery on Monday based upon safety for travel. Patient is agreeable.  Routing to provider for final review. Patient agreeable to disposition. Will close encounter.

## 2015-11-22 NOTE — Telephone Encounter (Signed)
Patient wanting to know when will she be contacted if her surgery is cancelled due to inclement weather on Monday.

## 2015-11-22 NOTE — Telephone Encounter (Signed)
Call to patient. See previous phone encounter. See account notes. Due to precert issue and with approaching inclement weather concerns, surgery rescheduled to 12-02-15. Patient agreeable to date and time. Routing to provider for final review. Patient agreeable to disposition. Will close encounter.

## 2015-11-22 NOTE — Telephone Encounter (Signed)
Left message to call [facility] at [phone].  Need to advise patient that I have spoken with Dr.Miller who will contact the patient on Sunday morning to discuss inclement weather. At that time a decision will be made based on safety for the patient to travel.

## 2015-11-26 ENCOUNTER — Telehealth: Payer: Self-pay | Admitting: Obstetrics & Gynecology

## 2015-11-26 NOTE — Telephone Encounter (Signed)
Spoke with pt regarding surgery and provided patient with [facility] precert/authorization [id number].

## 2015-12-01 MED ORDER — METRONIDAZOLE IN NACL 5-0.79 MG/ML-% IV SOLN
500.0000 mg | INTRAVENOUS | Status: AC
  Administered 2015-12-02: 500 mg via INTRAVENOUS
  Filled 2015-12-01: qty 100

## 2015-12-01 MED ORDER — CIPROFLOXACIN IN D5W 400 MG/200ML IV SOLN
400.0000 mg | INTRAVENOUS | Status: AC
  Administered 2015-12-02: 400 mg via INTRAVENOUS
  Filled 2015-12-01: qty 200

## 2015-12-02 ENCOUNTER — Ambulatory Visit (HOSPITAL_COMMUNITY): Payer: 59 | Admitting: Certified Registered Nurse Anesthetist

## 2015-12-02 ENCOUNTER — Ambulatory Visit: Payer: 59 | Admitting: Obstetrics & Gynecology

## 2015-12-02 ENCOUNTER — Ambulatory Visit (HOSPITAL_COMMUNITY)
Admission: RE | Admit: 2015-12-02 | Discharge: 2015-12-03 | Disposition: A | Discharge status: Home / Self Care | Payer: 59 | Source: Ambulatory Visit | Attending: Obstetrics & Gynecology | Admitting: Obstetrics & Gynecology

## 2015-12-02 ENCOUNTER — Encounter (HOSPITAL_COMMUNITY)
Admission: RE | Disposition: A | Discharge status: Home / Self Care | Payer: Self-pay | Source: Ambulatory Visit | Attending: Obstetrics & Gynecology

## 2015-12-02 ENCOUNTER — Encounter (HOSPITAL_COMMUNITY): Payer: Self-pay | Admitting: *Deleted

## 2015-12-02 DIAGNOSIS — Z882 Allergy status to sulfonamides status: Secondary | ICD-10-CM | POA: Diagnosis not present

## 2015-12-02 DIAGNOSIS — L930 Discoid lupus erythematosus: Secondary | ICD-10-CM | POA: Insufficient documentation

## 2015-12-02 DIAGNOSIS — N809 Endometriosis, unspecified: Secondary | ICD-10-CM | POA: Diagnosis not present

## 2015-12-02 DIAGNOSIS — N938 Other specified abnormal uterine and vaginal bleeding: Principal | ICD-10-CM | POA: Insufficient documentation

## 2015-12-02 DIAGNOSIS — E739 Lactose intolerance, unspecified: Secondary | ICD-10-CM | POA: Insufficient documentation

## 2015-12-02 DIAGNOSIS — M069 Rheumatoid arthritis, unspecified: Secondary | ICD-10-CM | POA: Insufficient documentation

## 2015-12-02 DIAGNOSIS — D250 Submucous leiomyoma of uterus: Secondary | ICD-10-CM | POA: Diagnosis not present

## 2015-12-02 DIAGNOSIS — D259 Leiomyoma of uterus, unspecified: Secondary | ICD-10-CM | POA: Diagnosis not present

## 2015-12-02 DIAGNOSIS — Z880 Allergy status to penicillin: Secondary | ICD-10-CM | POA: Diagnosis not present

## 2015-12-02 DIAGNOSIS — N800 Endometriosis of uterus: Secondary | ICD-10-CM | POA: Diagnosis not present

## 2015-12-02 DIAGNOSIS — G43909 Migraine, unspecified, not intractable, without status migrainosus: Secondary | ICD-10-CM | POA: Diagnosis not present

## 2015-12-02 HISTORY — PX: BILATERAL SALPINGECTOMY: SHX5743

## 2015-12-02 HISTORY — PX: LAPAROSCOPIC HYSTERECTOMY: SHX1926

## 2015-12-02 HISTORY — PX: CYSTOSCOPY: SHX5120

## 2015-12-02 LAB — PREGNANCY, URINE: Preg Test, Ur: NEGATIVE

## 2015-12-02 SURGERY — HYSTERECTOMY, TOTAL, LAPAROSCOPIC
Anesthesia: General | Site: Bladder

## 2015-12-02 MED ORDER — SIMETHICONE 80 MG PO CHEW: 80.0000 mg | CHEWABLE_TABLET | Freq: Four times a day (QID) | ORAL | Status: DC | PRN

## 2015-12-02 MED ORDER — KETOROLAC TROMETHAMINE 30 MG/ML IJ SOLN
30.0000 mg | Freq: Four times a day (QID) | INTRAMUSCULAR | Status: DC
  Administered 2015-12-02 – 2015-12-03 (×2): 30 mg via INTRAVENOUS
  Filled 2015-12-02 (×3): qty 1

## 2015-12-02 MED ORDER — MIDAZOLAM HCL 2 MG/2ML IJ SOLN
INTRAMUSCULAR | Status: AC
  Filled 2015-12-02: qty 2

## 2015-12-02 MED ORDER — MORPHINE SULFATE (PF) 4 MG/ML IV SOLN: 1.0000 mg | INTRAVENOUS | Status: DC | PRN

## 2015-12-02 MED ORDER — MENTHOL 3 MG MT LOZG: 1.0000 | LOZENGE | OROMUCOSAL | Status: DC | PRN

## 2015-12-02 MED ORDER — PROMETHAZINE HCL 25 MG/ML IJ SOLN: 6.2500 mg | INTRAMUSCULAR | Status: DC | PRN

## 2015-12-02 MED ORDER — PROPOFOL 10 MG/ML IV BOLUS
INTRAVENOUS | Status: AC
  Filled 2015-12-02: qty 20

## 2015-12-02 MED ORDER — NEOSTIGMINE METHYLSULFATE 10 MG/10ML IV SOLN
INTRAVENOUS | Status: AC
  Filled 2015-12-02: qty 1

## 2015-12-02 MED ORDER — LACTATED RINGERS IV SOLN
INTRAVENOUS | Status: DC
  Administered 2015-12-02 (×3): via INTRAVENOUS

## 2015-12-02 MED ORDER — HYDROMORPHONE HCL 1 MG/ML IJ SOLN
INTRAMUSCULAR | Status: DC | PRN
  Administered 2015-12-02 (×2): 0.5 mg via INTRAVENOUS

## 2015-12-02 MED ORDER — PHENYLEPHRINE 40 MCG/ML (10ML) SYRINGE FOR IV PUSH (FOR BLOOD PRESSURE SUPPORT)
PREFILLED_SYRINGE | INTRAVENOUS | Status: AC
  Filled 2015-12-02: qty 10

## 2015-12-02 MED ORDER — GLYCOPYRROLATE 0.2 MG/ML IJ SOLN
INTRAMUSCULAR | Status: DC | PRN
  Administered 2015-12-02: 0.4 mg via INTRAVENOUS

## 2015-12-02 MED ORDER — PANTOPRAZOLE SODIUM 40 MG IV SOLR
40.0000 mg | Freq: Every day | INTRAVENOUS | Status: DC
  Administered 2015-12-02: 40 mg via INTRAVENOUS
  Filled 2015-12-02: qty 40

## 2015-12-02 MED ORDER — SODIUM CHLORIDE 0.9 % IV SOLN
INTRAVENOUS | Status: DC | PRN
  Administered 2015-12-02: 3 mL
  Administered 2015-12-02: 53 mL
  Administered 2015-12-02: 4 mL

## 2015-12-02 MED ORDER — LIDOCAINE HCL (CARDIAC) 20 MG/ML IV SOLN
INTRAVENOUS | Status: DC | PRN
  Administered 2015-12-02: 60 mg via INTRAVENOUS

## 2015-12-02 MED ORDER — DEXTROSE-NACL 5-0.45 % IV SOLN: INTRAVENOUS | Status: DC

## 2015-12-02 MED ORDER — ONDANSETRON HCL 4 MG/2ML IJ SOLN
INTRAMUSCULAR | Status: DC | PRN
  Administered 2015-12-02: 4 mg via INTRAVENOUS

## 2015-12-02 MED ORDER — KETOROLAC TROMETHAMINE 30 MG/ML IJ SOLN
INTRAMUSCULAR | Status: AC
  Filled 2015-12-02: qty 1

## 2015-12-02 MED ORDER — DEXAMETHASONE SODIUM PHOSPHATE 4 MG/ML IJ SOLN
INTRAMUSCULAR | Status: AC
  Filled 2015-12-02: qty 1

## 2015-12-02 MED ORDER — HYDROMORPHONE HCL 1 MG/ML IJ SOLN
INTRAMUSCULAR | Status: AC
  Filled 2015-12-02: qty 1

## 2015-12-02 MED ORDER — PHENYLEPHRINE HCL 10 MG/ML IJ SOLN
INTRAMUSCULAR | Status: DC | PRN
  Administered 2015-12-02 (×3): .04 mg via INTRAVENOUS

## 2015-12-02 MED ORDER — GLYCOPYRROLATE 0.2 MG/ML IJ SOLN
INTRAMUSCULAR | Status: AC
  Filled 2015-12-02: qty 3

## 2015-12-02 MED ORDER — LACTATED RINGERS IV BOLUS (SEPSIS): 500.0000 mL | Freq: Once | INTRAVENOUS | Status: DC

## 2015-12-02 MED ORDER — KETOROLAC TROMETHAMINE 30 MG/ML IJ SOLN
INTRAMUSCULAR | Status: DC | PRN
  Administered 2015-12-02: 30 mg via INTRAVENOUS

## 2015-12-02 MED ORDER — ALUM & MAG HYDROXIDE-SIMETH 200-200-20 MG/5ML PO SUSP: 30.0000 mL | ORAL | Status: DC | PRN

## 2015-12-02 MED ORDER — MIDAZOLAM HCL 2 MG/2ML IJ SOLN
INTRAMUSCULAR | Status: DC | PRN
  Administered 2015-12-02: 2 mg via INTRAVENOUS

## 2015-12-02 MED ORDER — ACETAMINOPHEN 325 MG PO TABS: 650.0000 mg | ORAL_TABLET | ORAL | Status: DC | PRN

## 2015-12-02 MED ORDER — ROCURONIUM BROMIDE 100 MG/10ML IV SOLN
INTRAVENOUS | Status: DC | PRN
  Administered 2015-12-02: 50 mg via INTRAVENOUS
  Administered 2015-12-02: 10 mg via INTRAVENOUS

## 2015-12-02 MED ORDER — FENTANYL CITRATE (PF) 100 MCG/2ML IJ SOLN
INTRAMUSCULAR | Status: DC | PRN
  Administered 2015-12-02: 100 ug via INTRAVENOUS
  Administered 2015-12-02: 25 ug via INTRAVENOUS
  Administered 2015-12-02 (×3): 50 ug via INTRAVENOUS

## 2015-12-02 MED ORDER — SODIUM CHLORIDE 0.9 % IJ SOLN
INTRAMUSCULAR | Status: AC
  Filled 2015-12-02: qty 50

## 2015-12-02 MED ORDER — SCOPOLAMINE 1 MG/3DAYS TD PT72
1.0000 | MEDICATED_PATCH | Freq: Once | TRANSDERMAL | Status: DC
  Administered 2015-12-02: 1.5 mg via TRANSDERMAL

## 2015-12-02 MED ORDER — NEOSTIGMINE METHYLSULFATE 10 MG/10ML IV SOLN
INTRAVENOUS | Status: DC | PRN
  Administered 2015-12-02: 3 mg via INTRAVENOUS

## 2015-12-02 MED ORDER — HYDROMORPHONE HCL 1 MG/ML IJ SOLN
0.2500 mg | INTRAMUSCULAR | Status: DC | PRN
  Administered 2015-12-02: 0.5 mg via INTRAVENOUS

## 2015-12-02 MED ORDER — KETOROLAC TROMETHAMINE 30 MG/ML IJ SOLN: 30.0000 mg | Freq: Four times a day (QID) | INTRAMUSCULAR | Status: DC

## 2015-12-02 MED ORDER — SODIUM CHLORIDE 0.9 % IJ SOLN
INTRAMUSCULAR | Status: AC
  Filled 2015-12-02: qty 3

## 2015-12-02 MED ORDER — FENTANYL CITRATE (PF) 100 MCG/2ML IJ SOLN
INTRAMUSCULAR | Status: AC
  Filled 2015-12-02: qty 2

## 2015-12-02 MED ORDER — PROPOFOL 10 MG/ML IV BOLUS
INTRAVENOUS | Status: DC | PRN
  Administered 2015-12-02: 150 mg via INTRAVENOUS

## 2015-12-02 MED ORDER — MEPERIDINE HCL 25 MG/ML IJ SOLN: 6.2500 mg | INTRAMUSCULAR | Status: DC | PRN

## 2015-12-02 MED ORDER — SCOPOLAMINE 1 MG/3DAYS TD PT72
MEDICATED_PATCH | TRANSDERMAL | Status: AC
  Administered 2015-12-02: 1.5 mg via TRANSDERMAL
  Filled 2015-12-02: qty 1

## 2015-12-02 MED ORDER — STERILE WATER FOR IRRIGATION IR SOLN
Status: DC | PRN
  Administered 2015-12-02: 1000 mL

## 2015-12-02 MED ORDER — KETOROLAC TROMETHAMINE 30 MG/ML IJ SOLN: 30.0000 mg | Freq: Once | INTRAMUSCULAR | Status: DC

## 2015-12-02 MED ORDER — FENTANYL CITRATE (PF) 250 MCG/5ML IJ SOLN
INTRAMUSCULAR | Status: AC
  Filled 2015-12-02: qty 5

## 2015-12-02 MED ORDER — ONDANSETRON HCL 4 MG/2ML IJ SOLN
INTRAMUSCULAR | Status: AC
  Filled 2015-12-02: qty 2

## 2015-12-02 MED ORDER — ROPIVACAINE HCL 5 MG/ML IJ SOLN
INTRAMUSCULAR | Status: AC
  Filled 2015-12-02: qty 30

## 2015-12-02 MED ORDER — GLYCOPYRROLATE 0.2 MG/ML IJ SOLN
INTRAMUSCULAR | Status: AC
  Filled 2015-12-02: qty 2

## 2015-12-02 MED ORDER — OXYCODONE-ACETAMINOPHEN 5-325 MG PO TABS: 1.0000 | ORAL_TABLET | ORAL | Status: DC | PRN

## 2015-12-02 MED ORDER — ROCURONIUM BROMIDE 100 MG/10ML IV SOLN
INTRAVENOUS | Status: AC
  Filled 2015-12-02: qty 1

## 2015-12-02 MED ORDER — LIDOCAINE HCL (CARDIAC) 20 MG/ML IV SOLN
INTRAVENOUS | Status: AC
  Filled 2015-12-02: qty 5

## 2015-12-02 MED ORDER — SODIUM CHLORIDE 0.9 % IJ SOLN
INTRAMUSCULAR | Status: DC | PRN
  Administered 2015-12-02: 10 mL

## 2015-12-02 MED ORDER — DEXAMETHASONE SODIUM PHOSPHATE 10 MG/ML IJ SOLN
INTRAMUSCULAR | Status: DC | PRN
  Administered 2015-12-02: 4 mg via INTRAVENOUS

## 2015-12-02 SURGICAL SUPPLY — 56 items
BENZOIN TINCTURE PRP APPL 2/3 (GAUZE/BANDAGES/DRESSINGS) IMPLANT
BLADE SURG 15 STRL LF C SS BP (BLADE) ×3 IMPLANT
BLADE SURG 15 STRL SS (BLADE) ×2
CABLE HIGH FREQUENCY MONO STRZ (ELECTRODE) ×5 IMPLANT
CELL SAVER LIPIGURD (MISCELLANEOUS) ×3 IMPLANT
CLOTH BEACON ORANGE TIMEOUT ST (SAFETY) ×5 IMPLANT
COVER BACK TABLE 60X90IN (DRAPES) ×5 IMPLANT
COVER LIGHT HANDLE  1/PK (MISCELLANEOUS) ×2
COVER LIGHT HANDLE 1/PK (MISCELLANEOUS) ×3 IMPLANT
DISSECTOR BLUNT TIP ENDO 5MM (MISCELLANEOUS) IMPLANT
DRSG OPSITE POSTOP 3X4 (GAUZE/BANDAGES/DRESSINGS) ×5 IMPLANT
DURAPREP 26ML APPLICATOR (WOUND CARE) ×5 IMPLANT
EVACUATOR SMOKE 8.L (FILTER) ×10 IMPLANT
EXTRT SYSTEM ALEXIS 14CM (MISCELLANEOUS) ×5
FORCEPS CUTTING 33CM 5MM (CUTTING FORCEPS) IMPLANT
GLOVE BIOGEL PI IND STRL 7.0 (GLOVE) ×15 IMPLANT
GLOVE BIOGEL PI INDICATOR 7.0 (GLOVE) ×10
GLOVE ECLIPSE 6.5 STRL STRAW (GLOVE) ×10 IMPLANT
LIQUID BAND (GAUZE/BANDAGES/DRESSINGS) ×5 IMPLANT
NS IRRIG 1000ML POUR BTL (IV SOLUTION) ×5 IMPLANT
OCCLUDER COLPOPNEUMO (BALLOONS) ×5 IMPLANT
PACK LAPAROSCOPY BASIN (CUSTOM PROCEDURE TRAY) ×5 IMPLANT
PACK ROBOTIC GOWN (GOWN DISPOSABLE) ×5 IMPLANT
PAD TRENDELENBURG POSITION (MISCELLANEOUS) ×5 IMPLANT
RTRCTR WOUND ALEXIS 18CM SML (INSTRUMENTS) ×5
SAVER CELL AAL HAEMONETICS (INSTRUMENTS) ×3 IMPLANT
SCISSORS LAP 5X35 DISP (ENDOMECHANICALS) IMPLANT
SET CYSTO W/LG BORE CLAMP LF (SET/KITS/TRAYS/PACK) IMPLANT
SET IRRIG TUBING LAPAROSCOPIC (IRRIGATION / IRRIGATOR) ×5 IMPLANT
SET TRI-LUMEN FLTR TB AIRSEAL (TUBING) ×5 IMPLANT
SHEARS HARMONIC ACE PLUS 36CM (ENDOMECHANICALS) ×5 IMPLANT
SLEEVE XCEL OPT CAN 5 100 (ENDOMECHANICALS) ×5 IMPLANT
SOLUTION ELECTROLUBE (MISCELLANEOUS) ×5 IMPLANT
SPATULA 33CM PLASMA (CUTTING FORCEPS) IMPLANT
SUT DVC VLOC 180 2-0 12IN GS21 (SUTURE)
SUT VIC AB 0 CT1 36 (SUTURE) ×10 IMPLANT
SUT VIC AB 3-0 PS2 18 (SUTURE) ×2
SUT VIC AB 3-0 PS2 18XBRD (SUTURE) ×3 IMPLANT
SUT VICRYL 0 UR6 27IN ABS (SUTURE) ×5 IMPLANT
SUT VLOC 180 0 9IN  GS21 (SUTURE) ×2
SUT VLOC 180 0 9IN GS21 (SUTURE) ×3 IMPLANT
SUTURE DVC VL 180 2-0 12INGS21 (SUTURE) IMPLANT
SYR 50ML LL SCALE MARK (SYRINGE) ×5 IMPLANT
SYRINGE 10CC LL (SYRINGE) ×5 IMPLANT
TIP UTERINE 5.1X6CM LAV DISP (MISCELLANEOUS) IMPLANT
TIP UTERINE 6.7X10CM GRN DISP (MISCELLANEOUS) IMPLANT
TIP UTERINE 6.7X6CM WHT DISP (MISCELLANEOUS) ×5 IMPLANT
TIP UTERINE 6.7X8CM BLUE DISP (MISCELLANEOUS) ×5 IMPLANT
TOWEL OR 17X24 6PK STRL BLUE (TOWEL DISPOSABLE) ×10 IMPLANT
TRAY FOLEY CATH SILVER 14FR (SET/KITS/TRAYS/PACK) ×5 IMPLANT
TROCAR BALLN 12MMX100 BLUNT (TROCAR) IMPLANT
TROCAR PORT AIRSEAL 5X120 (TROCAR) ×5 IMPLANT
TROCAR XCEL NON-BLD 11X100MML (ENDOMECHANICALS) IMPLANT
TROCAR XCEL NON-BLD 5MMX100MML (ENDOMECHANICALS) ×5 IMPLANT
WARMER LAPAROSCOPE (MISCELLANEOUS) ×5 IMPLANT
WATER STERILE IRR 1000ML POUR (IV SOLUTION) ×5 IMPLANT

## 2015-12-02 NOTE — Anesthesia Preprocedure Evaluation (Addendum)
Anesthesia Evaluation  Patient identified by MRN, date of birth, ID band Patient awake    Reviewed: Allergy & Precautions, H&P , NPO status , Patient's Chart, lab work & pertinent test results  Airway Mallampati: I  TM Distance: >3 FB Neck ROM: full    Dental no notable dental hx.    Pulmonary neg pulmonary ROS,    Pulmonary exam normal        Cardiovascular negative cardio ROS Normal cardiovascular exam     Neuro/Psych negative psych ROS   GI/Hepatic negative GI ROS, Neg liver ROS,   Endo/Other  negative endocrine ROS  Renal/GU negative Renal ROS     Musculoskeletal   Abdominal Normal abdominal exam  (+)   Peds  Hematology   Anesthesia Other Findings   Reproductive/Obstetrics (+) Pregnancy                            Anesthesia Physical Anesthesia Plan  ASA: I  Anesthesia Plan: General   Post-op Pain Management:    Induction: Intravenous  Airway Management Planned: Oral ETT  Additional Equipment:   Intra-op Plan:   Post-operative Plan: Extubation in OR  Informed Consent: I have reviewed the patients History and Physical, chart, labs and discussed the procedure including the risks, benefits and alternatives for the proposed anesthesia with the patient or authorized representative who has indicated his/her understanding and acceptance.     Plan Discussed with: CRNA and Surgeon  Anesthesia Plan Comments:         Anesthesia Quick Evaluation

## 2015-12-02 NOTE — Progress Notes (Signed)
Day of Surgery Procedure(s) (LRB): HYSTERECTOMY TOTAL LAPAROSCOPIC (N/A) BILATERAL SALPINGECTOMY (Bilateral) CYSTOSCOPY (N/A)  Subjective: Patient reports good pain control and no nausea.  Feels urge to void, just hasn't yet.  Minimal spotting.  No dizziness.   Objective: I have reviewed patient's vital signs, intake and output, medications and labs.  General: alert and cooperative Resp: clear to auscultation bilaterally Cardio: regular rate and rhythm, S1, S2 normal, no murmur, click, rub or gallop GI: soft, non-tender; bowel sounds normal; no masses,  no organomegaly and incision: clean, dry and intact Extremities: extremities normal, atraumatic, no cyanosis or edema Vaginal Bleeding: minimal  Assessment: s/p Procedure(s): HYSTERECTOMY TOTAL LAPAROSCOPIC (N/A) BILATERAL SALPINGECTOMY (Bilateral) CYSTOSCOPY (N/A): stable  Plan: Advance diet Encourage ambulation CBC in am  Bladder scan now.  May need IV bolus vs foley to SD overnight.  LOS: 0 days    Hale Bogus [facility] 12/02/2015, 9:19 PM

## 2015-12-02 NOTE — H&P (Signed)
Kathleen Barron is an 40 y.o. female G0 SWF here for definitive treatment of fibroid uterus and DUB.  Pt has 8.4 x 7.2 x 5.8cm uterus with multiple fibroids, largest measuring 4cm.  She has been on Nuva ring with inadequate control of her bleeding.  She does not want to be on any additional hormonal therapy although she has been counseled about alternative including changing to another hormonal method, myomectomy, endometrial ablation and conservative management.  She has decided to proceed with definitive management.  Risks and benefits have been discussed and are documented in my office charge.  Pt is here and ready to proceed.  Pertinent Gynecological History: Menses: heavy with mid cycle bleeding Contraception: abstinence DES exposure: denies Blood transfusions: none Sexually transmitted diseases: genital warts Previous GYN Procedures: none  Last mammogram: n/a Date: n/a Last pap: normal (ASCUS with NEG HR HPV) Date: 2/16 OB History: G0, P0   Menstrual History: No LMP recorded.    Past Medical History  Diagnosis Date  . Genital warts   . Discoid lupus   . Migraines     no aura  . Lactose intolerance   . Arthritis     osteoartritis and rheumatoid   . Anemia     as a teenager     Past Surgical History  Procedure Laterality Date  . Tear duct surgery      infant    Family History  Problem Relation Age of Onset  . Lupus Mother   . Hypertension Mother   . Hypertension Maternal Grandmother   . Hypertension Maternal Grandfather   . Cancer Maternal Grandfather     prostate    Social History:  reports that she has never smoked. She has never used smokeless tobacco. She reports that she drinks alcohol. She reports that she does not use illicit drugs.  Allergies:  Allergies  Allergen Reactions  . Lactose Intolerance (Gi)   . Penicillins     Has never had penicillin, since family members had reaction her mother always told her not to take it  . Sulfa Antibiotics Hives,  Itching and Rash    Prescriptions prior to admission  Medication Sig Dispense Refill Last Dose  . b complex vitamins tablet Take 1 tablet by mouth daily.   Past Month at Unknown time  . Biotin 1 MG CAPS Take 1 tablet by mouth daily.    Past Month at Unknown time  . etonogestrel-ethinyl estradiol (NUVARING) 0.12-0.015 MG/24HR vaginal ring Insert vaginally for 3 weeks and then replace with new ring.  Use continuous active ring for 3 months. 6 each 3 Taking  . Multiple Vitamins-Minerals (MULTIVITAMIN PO) Take 1 tablet by mouth daily.    Past Month at Unknown time  . Probiotic Product (PROBIOTIC ADVANCED PO) Take 1 tablet by mouth daily.    Past Month at Unknown time  . Vitamin D, Cholecalciferol, 1000 UNITS CAPS Take 1 capsule by mouth daily.    Past Week at Unknown time  . ibuprofen (ADVIL,MOTRIN) 800 MG tablet Take 1 tablet (800 mg total) by mouth every 8 (eight) hours as needed. (Patient taking differently: Take 800 mg by mouth every 8 (eight) hours as needed for moderate pain. ) 30 tablet 0   . oxyCODONE-acetaminophen (PERCOCET) 5-325 MG tablet Take 1-2 tablets by mouth every 4 (four) hours as needed. use only as much as needed to relieve pain 30 tablet 0     Review of Systems  All other systems reviewed and are negative.  Blood pressure 143/77, pulse 98, temperature 97.5 F (36.4 C), temperature source Oral, resp. rate 18, SpO2 100 %. Physical Exam  Constitutional: She is oriented to person, place, and time. She appears well-developed and well-nourished.  Cardiovascular: Normal rate and regular rhythm.   Respiratory: Effort normal and breath sounds normal.  Neurological: She is alert and oriented to person, place, and time.  Skin: Skin is warm and dry.  Psychiatric: She has a normal mood and affect.    Results for orders placed or performed during the hospital encounter of 12/02/15 (from the past 24 hour(s))  Pregnancy, urine     Status: None   Collection Time: 12/02/15 11:00 AM   Result Value Ref Range   Preg Test, Ur NEGATIVE NEGATIVE    No results found.  Assessment/Plan: 40 yo G0 SWF with symptomatic fibroid uterus here for definitive surgery with TLH/bilateral salpingectomy/possible BSO/cystoscopy.  Pt is aware I may need to morcellate the uterus but this will either be done in the vagina or in a bag and abdominally.  We have reviewed this with risks and benefits.  Pt here and ready to proceed.  Hale Bogus SUZANNE 12/02/2015, 11:39 AM

## 2015-12-02 NOTE — Op Note (Signed)
12/02/2015  3:38 PM  PATIENT:  Kathleen Barron  40 y.o. female  PRE-OPERATIVE DIAGNOSIS:  DYSFUNCTIONAL UTERINE BLEEDING FIBROID UTERUS   POST-OPERATIVE DIAGNOSIS:  Dysfunctional uterine bleeding, fibroids, enlarged uterus, endometriosis  PROCEDURE:  Procedure(s): HYSTERECTOMY TOTAL LAPAROSCOPIC BILATERAL SALPINGECTOMY CYSTOSCOPY  SURGEON:  Jeron Grahn SUZANNE  ASSISTANTS: Sumner Boast, MD   ANESTHESIA:   general  ESTIMATED BLOOD LOSS: 20cc  BLOOD ADMINISTERED:none   FLUIDS: 2000cc  UOP: 100cc clear UOP  SPECIMEN:  Uterus, cervix, bilateral fallopian tubes  DISPOSITION OF SPECIMEN:  PATHOLOGY  FINDINGS: enlarged fibroid uterus, area of endometriosis on posterior pelvis which on rectal exam is just superficial to the rectum  DESCRIPTION OF OPERATION: Patient is taken to the operating room. She is placed in the supine position. She is a running IV in place. Informed consent was present on the chart. SCDs on her lower extremities and functioning properly. General endotracheal anesthesia was administered by the anesthesia staff without difficulty. Dr. Jillyn Hidden oversaw case. Once adequate anesthesia was confirmed the legs are placed in the low lithotomy position in [place]. Her arms were tucked by the side.   Dura prep was then used to prep the abdomen and Betadine was used to prep the inner thighs, perineum and vagina. Once 3 minutes had past the patient was draped in a normal standard fashion. The legs were lifted to the high lithotomy position. The cervix was visualized by placing a heavy weighted speculum in the posterior aspect of the vagina and using a curved Deaver retractor to the retract anteriorly. The anterior lip of the cervix was grasped with single-tooth tenaculum.  The cervix sounded to 8 cm. Pratt dilators were used to dilate the cervix up to a #21. A RUMI uterine manipulator was obtained. A #8 disposable tip was placed on the RUMI manipulator as well as a  large, silver KOH ring. This was passed through the cervix and the bulb of the disposable tip was inflated with 10 cc of normal saline. There was a good fit of the KOH ring around the cervix. The tenaculum was removed. There is also good manipulation of the uterus. The speculum and retractor were removed as well. A Foley catheter was placed to straight drain.  Clear urine was noted. Legs were lowered to the low lithotomy position and attention was turned the abdomen.  The umbilicus was everted.  A Veress needle was obtained. Syringe of sterile saline was placed on a open Veress needle.  This was passed into the umbilicus until just when the fluid started to drip.  Then low flow CO2 gas was attached the needle and the pneumoperitoneum was achieved without difficulty. Once 2.2 liters of gas was in the abdomen the Veress needle was removed and a 5 millimeter non-bladed Optiview trocar and port were passed directly to the abdomen. The laparoscope was then used to confirm intraperitoneal placement. A large uterus with fibroids was noted.  There was some endometriosis appearing tissue inferior to the cervix.  Rectal exam confirmed this was superior to the rectum.  Decision was made to not excise this for fear of rectal injury.  Ovaries were normal.  Locations for RLQ and LLQ ports were noted by transillumination of the abdominal wall.  60mm skin incisions were made and then 65mm bladed ports were placed.  Finally a midline incision was made about 4 cm above the pubic symphysis.  The skin was incised about 1cm and a non-bladed 11 port was placed with direct visualization of the laparoscope.  Ureters were identifies.  Attention was turned to the left side. With uterus on stretch the left tube was excised off the ovary and mesosalpinx was dissected to free the tube. Ligasure was used.  Then the left utero-ovarian pedicle was serially clamped cauterized and incised using the ligasure device. Left round ligament was  serially clamped cauterized and incised. The anterior and posterior peritoneum of the inferior leaf of the broad ligament were opened. The beginning of the baldder flap was created.  The bladder was taken down below the level of the KOH ring. The left uterine artery was skeletonized.    Attention was turned the right side.  The uterus was placed on stretch to the opposite side.  The tube was excised off the ovary using sharp dissection a bipolar cautery.  The mesosalpinx was incised freeing the tube. Then the right uterine ovarian pedicle was serially clamped cauterized and incised. Next the right round ligament was serially clamped cauterized and incised. The anterior posterior peritoneum of the inferiorly for the broad ligament were opened. The anterior peritoneum was carried across to the dissection on the left side. The remainder of the bladder flap was created using sharp dissection. The bladder was well below the level of the KOH ring. The left uterine artery skeletonized. Then the left uterine artery, above the level of the KOH ring, was serially clamped cauterized and incised. Attention was turned back to the left uterine artery.   Just superior to the KOH ring this vessel was serially clamped, cauterized, and incised.The uterus was devascularized at this point.  The colpotomy was performed a starting in the midline and using a harmonic scalpel with the inferior edge of the open blade  This was carried around a circumferential fashion until the vaginal mucosa was completely incised in the specimen was freed.  The specimen was too large to be delivered through the vagina.  The uterus was placed in the upper abdomen.  A vaginal occlusive device was used to maintain the pneumoperitoneum  Instruments were changed with a needle driver and Kobra grasper.  Using a 9 inch V. lock suture, the cuff was closed by incorporating the anterior and posterior vaginal mucosa in each stitch. This was carried across all  the way to the left corner and a running fashion. Two stitches were brought back towards the midline and the suture was cut flush with the vagina. The needle was brought out the pelvis. The pelvis was irrigated. All pedicles were inspected. No bleeding was noted.  Then the inferior midline incision was incised to about 2.5cm and the fascial incision was enlarged as well.  An Alexis tissue extraction bag was placed into the pelvis.  The specimen was placed in the bag.  The bag was brought back through the incision.  Then a small Alexis retractor was placed in the incision and tightened.  The uterus was grasped with thyroid tenaculum.  Using a C incision technique, the specimen was morcellated in the bag until the entire specimen was removed.  This morcellation took about 10 minutes.  The Alexis retractor and bag were removed.  This incision was closed at the fascial level with a running stitch of #0 Vicryl.    Attention was turned laparoscopically.  The pelvis was irrigated.  No bleeding was noted.  The inferior ports were removed and the pneumoperitoneum was relieved.  The pt was taken out of trendelenburg.  Midline port was removed.    The skin was then closed with subcuticular stitches  of 3-0 Vicryl. The skin was cleansed Dermabond was applied. Attention was then turned the vagina and the cuff was inspected. No bleeding was noted. The anterior posterior vaginal mucosa was incorporated in each stitch. The Foley catheter was removed.  Cystoscopy was performed.  No sutures or bladder injuries were noted.  Ureters were noted with normal urine jets from each one was seen.  Foley was not replaced.  Sponge, lap, needle, initially counts were correct x2. Patient tolerated the procedure very well. She was awakened from anesthesia, extubated and taken to recovery in stable condition.   COUNTS:  YES  PLAN OF CARE: Transfer to PACU

## 2015-12-02 NOTE — Anesthesia Procedure Notes (Signed)
Procedure Name: Intubation Date/Time: 12/02/2015 12:25 PM Performed by: Raenette Rover Pre-anesthesia Checklist: Patient identified, Emergency Drugs available, Suction available and Patient being monitored Patient Re-evaluated:Patient Re-evaluated prior to inductionOxygen Delivery Method: Circle system utilized Preoxygenation: Pre-oxygenation with 100% oxygen Intubation Type: IV induction Ventilation: Mask ventilation without difficulty Laryngoscope Size: Mac and 3 Grade View: Grade I Tube type: Oral Tube size: 7.0 mm Number of attempts: 1 Airway Equipment and Method: Stylet Placement Confirmation: ETT inserted through vocal cords under direct vision,  positive ETCO2,  CO2 detector and breath sounds checked- equal and bilateral Secured at: 21 cm Tube secured with: Tape Dental Injury: Teeth and Oropharynx as per pre-operative assessment

## 2015-12-02 NOTE — Anesthesia Postprocedure Evaluation (Signed)
Anesthesia Post Note  Patient: Kathleen Barron  Procedure(s) Performed: Procedure(s) (LRB): HYSTERECTOMY TOTAL LAPAROSCOPIC (N/A) BILATERAL SALPINGECTOMY (Bilateral) CYSTOSCOPY (N/A)  Patient location during evaluation: PACU Anesthesia Type: General Level of consciousness: awake Pain management: pain level controlled Vital Signs Assessment: post-procedure vital signs reviewed and stable Respiratory status: spontaneous breathing Cardiovascular status: stable Postop Assessment: no signs of nausea or vomiting Anesthetic complications: no    Last Vitals:  Filed Vitals:   12/02/15 1645 12/02/15 1700  BP: 98/55 93/50  Pulse: 58 61  Temp:  36.8 C  Resp: 14 16    Last Pain:  Filed Vitals:   12/02/15 1708  PainSc: [place]

## 2015-12-02 NOTE — OR Nursing (Signed)
Family updated by phone per Dr. Sabra Heck

## 2015-12-02 NOTE — Transfer of Care (Signed)
Immediate Anesthesia Transfer of Care Note  Patient: Kathleen Barron  Procedure(s) Performed: Procedure(s): HYSTERECTOMY TOTAL LAPAROSCOPIC (N/A) BILATERAL SALPINGECTOMY (Bilateral) CYSTOSCOPY (N/A)  Patient Location: PACU  Anesthesia Type:General  Level of Consciousness: awake, alert , oriented and patient cooperative  Airway & Oxygen Therapy: Patient Spontanous Breathing and Patient connected to nasal cannula oxygen  Post-op Assessment: Report given to RN and Post -op Vital signs reviewed and stable  Post vital signs: Reviewed and stable  Last Vitals:  Filed Vitals:   12/02/15 1121  BP: 143/77  Pulse: 98  Temp: 36.4 C  Resp: 18    Complications: No apparent anesthesia complications

## 2015-12-02 NOTE — OR Nursing (Signed)
Family updated by phone per Dr. Sabra Heck @ [phone]

## 2015-12-03 ENCOUNTER — Encounter (HOSPITAL_COMMUNITY): Payer: Self-pay | Admitting: Obstetrics & Gynecology

## 2015-12-03 DIAGNOSIS — N938 Other specified abnormal uterine and vaginal bleeding: Secondary | ICD-10-CM | POA: Diagnosis not present

## 2015-12-03 LAB — CBC
HEMATOCRIT: 32 % — AB (ref 36.0–46.0)
HEMOGLOBIN: 10.7 g/dL — AB (ref 12.0–15.0)
MCH: 30.9 pg (ref 26.0–34.0)
MCHC: 33.4 g/dL (ref 30.0–36.0)
MCV: 92.5 fL (ref 78.0–100.0)
Platelets: 239 10*3/uL (ref 150–400)
RBC: 3.46 MIL/uL — ABNORMAL LOW (ref 3.87–5.11)
RDW: 12.5 % (ref 11.5–15.5)
WBC: 10.6 10*3/uL — ABNORMAL HIGH (ref 4.0–10.5)

## 2015-12-03 LAB — BASIC METABOLIC PANEL
ANION GAP: 9 (ref 5–15)
BUN: 7 mg/dL (ref 6–20)
CHLORIDE: 102 mmol/L (ref 101–111)
CO2: 22 mmol/L (ref 22–32)
Calcium: 8.2 mg/dL — ABNORMAL LOW (ref 8.9–10.3)
Creatinine, Ser: 0.53 mg/dL (ref 0.44–1.00)
GFR calc Af Amer: >60 mL/min (ref >60)
GLUCOSE: 129 mg/dL — AB (ref 65–99)
POTASSIUM: 3.5 mmol/L (ref 3.5–5.1)
Sodium: 133 mmol/L — ABNORMAL LOW (ref 135–145)

## 2015-12-03 MED ORDER — IBUPROFEN 800 MG PO TABS
800.0000 mg | ORAL_TABLET | Freq: Once | ORAL | Status: AC
  Administered 2015-12-03: 800 mg via ORAL
  Filled 2015-12-03: qty 1

## 2015-12-03 NOTE — Progress Notes (Signed)
1 Day Post-Op Procedure(s) (LRB): HYSTERECTOMY TOTAL LAPAROSCOPIC (N/A) BILATERAL SALPINGECTOMY (Bilateral) CYSTOSCOPY (N/A)  Subjective: Patient reports excellent pain control.  No nausea.  Voiding without difficulty.  No trouble walking.  Objective: I have reviewed patient's vital signs, intake and output, medications and labs. Tmax: 98.2, P: 58-75, R: 13-18, Pulse ox: 100% RA UOP: 650cc  Post op hb: 10.7   General: alert and cooperative Resp: clear to auscultation bilaterally Cardio: regular rate and rhythm, S1, S2 normal, no murmur, click, rub or gallop GI: soft, non-tender; bowel sounds normal; no masses,  no organomegaly and incision: clean, dry and intact Extremities: extremities normal, atraumatic, no cyanosis or edema Vaginal Bleeding: none  Assessment: s/p Procedure(s): HYSTERECTOMY TOTAL LAPAROSCOPIC (N/A) BILATERAL SALPINGECTOMY (Bilateral) CYSTOSCOPY (N/A): stable and progressing well  Plan: Discharge home  LOS: 1 day    Hale Bogus SUZANNE 12/03/2015, 8:41 AM

## 2015-12-03 NOTE — Anesthesia Postprocedure Evaluation (Signed)
Anesthesia Post Note  Patient: Kathleen Barron  Procedure(s) Performed: Procedure(s) (LRB): HYSTERECTOMY TOTAL LAPAROSCOPIC (N/A) BILATERAL SALPINGECTOMY (Bilateral) CYSTOSCOPY (N/A)  Patient location during evaluation: [facility] Anesthesia Type: General Level of consciousness: awake and alert Pain management: pain level controlled Vital Signs Assessment: post-procedure vital signs reviewed and stable Respiratory status: spontaneous breathing Cardiovascular status: stable Postop Assessment: no signs of nausea or vomiting and adequate PO intake Anesthetic complications: no    Last Vitals:  Filed Vitals:   12/03/15 0155 12/03/15 0500  BP: 116/65 116/66  Pulse: 70 66  Temp: 36.7 C 36.8 C  Resp: 18 16    Last Pain:  Filed Vitals:   12/03/15 0529  PainSc: [place]

## 2015-12-03 NOTE — Discharge Instructions (Signed)
Post Op Hysterectomy Instructions Please read the instructions below. Refer to these instructions for the next few weeks. These instructions provide you with general information on caring for yourself after surgery. Your caregiver may also give you specific instructions. While your treatment has been planned according to the most current medical practices available, unavoidable problems sometimes happen. If you have any problems or questions after you leave, please call your caregiver.  HOME CARE INSTRUCTIONS Healing will take time. You will have discomfort, tenderness, swelling and bruising at the operative site for a couple of weeks. This is normal and will get better as time goes on.   Only take over-the-counter or prescription medicines for pain, discomfort or fever as directed by your caregiver.   Do not take aspirin. It can cause bleeding.   Do not drive when taking pain medication.   Follow your caregivers advice regarding diet, exercise, lifting, driving and general activities.   Resume your usual diet as directed and allowed.   Get plenty of rest and sleep.   Do not douche, use tampons, or have sexual intercourse until your caregiver gives you permission. .   Take your temperature if you feel hot or flushed.   You may shower today when you get home.  No tub bath for one week.    Do not drink alcohol until you are not taking any narcotic pain medications.   Try to have someone home with you for a week or two to help with the household activities.   Be careful over the next two to three weeks with any activities at home that involve lifting, pushing, or pulling.  Listen to your body--if something feels uncomfortable to do, then don't do it.  Make sure you and your family understands everything about your operation and recovery.   Take off the patch behind your ear and the dressing on your abdomen on Thursday.  Walking up stairs is fine.  Do not sign any legal documents until  you feel normal again.   Keep all your follow-up appointments as recommended by your caregiver.   PLEASE CALL THE OFFICE IF:  There is swelling, redness or increasing pain in the wound area.   Pus is coming from the wound.   You notice a bad smell from the wound or surgical dressing.   You have pain, redness and swelling from the intravenous site.   The wound is breaking open (the edges are not staying together).    You develop pain or bleeding when you urinate.   You develop abnormal vaginal discharge.   You have any type of abnormal reaction or develop an allergy to your medication.   You need stronger pain medication for your pain   SEEK IMMEDIATE MEDICAL CARE:  You develop a temperature of 100.5 or higher.   You develop abdominal pain.   You develop chest pain.   You develop shortness of breath.   You pass out.   You develop pain, swelling or redness of your leg.   You develop heavy vaginal bleeding with or without blood clots.   MEDICATIONS:  Restart your regular medications BUT wait one week before restarting all vitamins and mineral supplements  Use Motrin 800mg  every 8 hours for the next several days.  This will help you use less Percocet.  Use the Percocet 5/325 1-2 tabs every 4-6 hours as needed for pain.  You may use an over the counter stool softener like Colace or Dulcolax to help with starting a bowel  movement.  Start the day after you go home.  Warm liquids, fluids, and ambulation help too.  If you have not had a bowel movement in four days, you need to call the office.

## 2015-12-03 NOTE — Progress Notes (Signed)
Pt verbalizes understanding of d/c instructions, medications, follow up appts, when to seek medical attention and belongings policy. Pt has no questions at this time. Pts mother is at the bedside, and her father is here to drive them home. IV was d/c by NT earlier with no complications. Pt d/c to main entrance accompanied by me and her family. Kathleen Barron

## 2015-12-03 NOTE — Addendum Note (Signed)
Addendum  created 12/03/15 0730 by Hewitt Blade, CRNA   Modules edited: Clinical Notes   Clinical Notes:  File: [id number]

## 2015-12-03 NOTE — Discharge Summary (Signed)
Physician Discharge Summary  Patient ID: MIRCLE FEDRICK MRN: [id number] DOB/AGE: 07-12-1976 40 y.o.  Admit date: 12/02/2015 Discharge date: 12/03/2015  Admission Diagnoses: enlarged, fibroid uterus, DUB  Discharge Diagnoses:  Active Problems:   * No active hospital problems. *  Discharged Condition: good  Hospital Course: Patient admitted through same day surgery.  She was taken to OR where TLH/bilateral salpingectomy/cystoscopy were performed.  Surgical findings included enlarged, fibroid uterus, and small areas of endometriosis on the posterior pelvix.  Surgery was uneventful.  EBL 20cc.  Foley catheter was removed before leaving OR.  Patient transferred to PACU where she was stable and then to 3rd floor for the remainder of her hospitalization.  During her post-op recovery, her vitals and stable and she was AF.  In evening of POD#0, she was able to transition to oral pain medications and regular diet.  She was able to ambulate and she had good pain control.  She was also able to void on her own.  Patient seen both in the evening of POD#0 and AM of POD#1.  In the AM of POD#1, she was without complaint.  Post op hb was 10.7, decreased from 13.7, pre-operatively.  At this point, patient was voiding, walking, having excellent pain control, had no nausea, and minimal vaginal bleeding.  She was ready for D/C.  Consults: None  Significant Diagnostic Studies: labs: post op hb: 10.7 and BMP with nl BUN and Creatinine  Treatments: surgery: TLH/bilateral salpingectomy/cystoscopy  Discharge Exam: Blood pressure 116/66, pulse 66, temperature 98.2 F (36.8 C), temperature source Oral, resp. rate 16, height 5\' 6"  (1.676 m), weight 130 lb (58.968 kg), SpO2 100 %. General appearance: alert and cooperative Resp: clear to auscultation bilaterally Cardio: regular rate and rhythm, S1, S2 normal, no murmur, click, rub or gallop GI: soft, non-tender; bowel sounds normal; no masses,  no  organomegaly Extremities: extremities normal, atraumatic, no cyanosis or edema Incision/Wound:c/d/i  Disposition: Final discharge disposition not confirmed     Medication List    STOP taking these medications        etonogestrel-ethinyl estradiol 0.12-0.015 MG/24HR vaginal ring  Commonly known as:  NUVARING      TAKE these medications        b complex vitamins tablet  Take 1 tablet by mouth daily.     Biotin 1 MG Caps  Take 1 tablet by mouth daily.     ibuprofen 800 MG tablet  Commonly known as:  ADVIL,MOTRIN  Take 1 tablet (800 mg total) by mouth every 8 (eight) hours as needed.     MULTIVITAMIN PO  Take 1 tablet by mouth daily.     oxyCODONE-acetaminophen 5-325 MG tablet  Commonly known as:  PERCOCET  Take 1-2 tablets by mouth every 4 (four) hours as needed. use only as much as needed to relieve pain     PROBIOTIC ADVANCED PO  Take 1 tablet by mouth daily.     Vitamin D (Cholecalciferol) 1000 units Caps  Take 1 capsule by mouth daily.           Follow-up Information    Follow up On 12/09/2015.   Why:  Appt time 9:15am      Signed: Lyman Speller 12/03/2015, 8:48 AM

## 2015-12-09 ENCOUNTER — Ambulatory Visit (INDEPENDENT_AMBULATORY_CARE_PROVIDER_SITE_OTHER): Payer: 59 | Admitting: Obstetrics & Gynecology

## 2015-12-09 ENCOUNTER — Encounter: Payer: Self-pay | Admitting: Obstetrics & Gynecology

## 2015-12-09 VITALS — BP 128/82 | HR 92 | Resp 16 | Ht 66.0 in | Wt 131.0 lb

## 2015-12-09 DIAGNOSIS — N852 Hypertrophy of uterus: Secondary | ICD-10-CM

## 2015-12-09 NOTE — Progress Notes (Signed)
Post Operative Visit  Procedure: HYSTERECTOMY TOTAL LAPAROSCOPIC (N/A Abdomen) [id number] CPT (R)]  BILATERAL SALPINGECTOMY (Bilateral Abdomen)  CYSTOSCOPY (N/A Bladder)   Days Post-op: 7  Subjective: Doing well.  No vaginal bleeding in two days.  Voiding without difficulty.  Having regular bowel movements.  Taking Motrin only for pain.  Hasn't driven yet.  No fevers.    Objective: BP 128/82 mmHg  Pulse 92  Resp 16  Ht 5\' 6"  (1.676 m)  Wt 131 lb (59.421 kg)  BMI 21.15 kg/m2  LMP 10/03/2015  EXAM General: alert and cooperative Resp: clear to auscultation bilaterally Cardio: regular rate and rhythm, S1, S2 normal, no murmur, click, rub or gallop GI: soft, non-tender; bowel sounds normal; no masses,  no organomegaly and incision: clean and dry Extremities: extremities normal, atraumatic, no cyanosis or edema Vaginal Bleeding: none  Assessment: s/p TLH/bilateral salpingectomy/cystoscopy.  Doing very well  Plan: Recheck 3 weeks Restrictions reviewed

## 2015-12-10 ENCOUNTER — Ambulatory Visit
Admission: RE | Admit: 2015-12-10 | Discharge: 2015-12-10 | Disposition: A | Discharge status: Home / Self Care | Payer: Commercial Managed Care - HMO | Source: Ambulatory Visit | Attending: Podiatry | Admitting: Podiatry

## 2015-12-10 DIAGNOSIS — G589 Mononeuropathy, unspecified: Secondary | ICD-10-CM | POA: Insufficient documentation

## 2015-12-10 DIAGNOSIS — M19071 Primary osteoarthritis, right ankle and foot: Secondary | ICD-10-CM | POA: Insufficient documentation

## 2015-12-10 DIAGNOSIS — M205X1 Other deformities of toe(s) (acquired), right foot: Secondary | ICD-10-CM | POA: Diagnosis not present

## 2015-12-11 ENCOUNTER — Telehealth: Payer: Self-pay | Admitting: *Deleted

## 2015-12-11 NOTE — Telephone Encounter (Addendum)
Dr. Milinda Pointer reviewed right foot MRI and request pt come in to discuss.  Orders to pt and transferred to schedulers.

## 2015-12-23 ENCOUNTER — Ambulatory Visit: Payer: 59 | Admitting: Obstetrics & Gynecology

## 2015-12-23 ENCOUNTER — Ambulatory Visit (INDEPENDENT_AMBULATORY_CARE_PROVIDER_SITE_OTHER): Payer: 59 | Admitting: Podiatry

## 2015-12-23 ENCOUNTER — Encounter: Payer: Self-pay | Admitting: Podiatry

## 2015-12-23 VITALS — BP 131/80 | HR 63 | Resp 16

## 2015-12-23 DIAGNOSIS — M205X1 Other deformities of toe(s) (acquired), right foot: Secondary | ICD-10-CM | POA: Diagnosis not present

## 2015-12-23 DIAGNOSIS — M19071 Primary osteoarthritis, right ankle and foot: Secondary | ICD-10-CM | POA: Diagnosis not present

## 2015-12-23 NOTE — Progress Notes (Signed)
She presents today for follow-up of pain across the dorsal aspect of her right foot and for her MRI review. She states that recently the soreness in the forefoot and the midfoot have not been as bad.  Objective: Vital signs are stable she is alert and oriented 3. Pulses remain palpable. She still has pain on palpation of the first metatarsophalangeal joint as well as range of motion. She also has pain on palpation and range of motion of the worse metatarsal medial cuneiform joint which was indicated by MRI to be arthritic in nature. I also reviewed radiographs today demonstrated to the patient for the osteoarthritis lies. She also has a painful hammertoe deformity fourth digit right foot with an adductor varus rotated hammertoe.  Assessment: Osteoarthritis first metatarsal medial cuneiform joint. Bunion deformity first metatarsophalangeal joint right foot. Hammertoe deformity fourth digit right foot.  Plan: We went over a consent form today line by line number by number giving her ample time to ask questions she saw fit regarding a Lapidus procedure with bunion repair a second metatarsal osteotomy shortening in nature as well as a hammertoe repair fourth digit right foot. I answered all of these questions to the best of my ability in layman's terms. She understands this and is amenable to it. We went over the consent form line by line number by number giving her ample time to ask questions she saw fit regarding these procedures I answered them to the best of my ability she understands the possible postop complications which may include but are not limited to postop pain bleeding swelling infection recurrence and need for further surgery also digit loss of 1 range of motion loss of digit loss of life. She understands that she will be nonweightbearing in a cast after the procedure utilizing crutches or a knee scooter. I will follow-up with her in the near future for surgical intervention. She did sign all 3  pages of the consent form.

## 2015-12-24 ENCOUNTER — Ambulatory Visit: Payer: BLUE CROSS/BLUE SHIELD | Admitting: Certified Nurse Midwife

## 2015-12-26 ENCOUNTER — Ambulatory Visit: Payer: 59 | Admitting: Obstetrics & Gynecology

## 2015-12-31 ENCOUNTER — Telehealth: Payer: Self-pay | Admitting: *Deleted

## 2015-12-31 NOTE — Telephone Encounter (Signed)
"  I saw Dr. Milinda Pointer about a week ago.  He said someone would call me to set up surgery.  I haven't heard anything yet."  When would you like to schedule?   Dr. Milinda Pointer does surgery on Fridays, his next available isn't until March 10.  He doesn't have anything on March 17.  "I guess put me down for March 31.  Is there anything I need to do to make sure it's pre-certed?  Do I need to pay anything up front to hold my spot?"  You do not need to do anything other than registering with the surgical center.  Your instructions are in your brochure.  I will pre-cert it with [facility] prior to your surgery.

## 2016-01-02 ENCOUNTER — Encounter: Payer: Self-pay | Admitting: Obstetrics & Gynecology

## 2016-01-02 ENCOUNTER — Ambulatory Visit (INDEPENDENT_AMBULATORY_CARE_PROVIDER_SITE_OTHER): Payer: 59 | Admitting: Obstetrics & Gynecology

## 2016-01-02 VITALS — BP 100/60 | HR 80 | Resp 14 | Ht 66.0 in | Wt 132.0 lb

## 2016-01-02 DIAGNOSIS — Z9889 Other specified postprocedural states: Secondary | ICD-10-CM

## 2016-01-02 NOTE — Progress Notes (Signed)
Post Operative Visit  Procedure: HYSTERECTOMY TOTAL LAPAROSCOPIC (N/A Abdomen) [id number] CPT (R)]  BILATERAL SALPINGECTOMY (Bilateral Abdomen)  CYSTOSCOPY (N/A Bladder)   Days Post-op: 1 month   Subjective: Doing well.  Energy is good.  Pt going back to work on Monday, Feb 20th.  Pt reports the numbness and pain down left leg is gone and low back pain is much better.  Denies vaginal bleeding.  Bladder and bowel function is good, per pt.    Objective: BP 100/60 mmHg  Pulse 80  Resp 14  Ht 5\' 6"  (1.676 m)  Wt 132 lb (59.875 kg)  BMI 21.32 kg/m2  LMP 10/03/2015  EXAM General: alert and cooperative Resp: clear to auscultation bilaterally Cardio: regular rate and rhythm, S1, S2 normal, no murmur, click, rub or gallop GI: soft, non-tender; bowel sounds normal; no masses,  no organomegaly and incision: clean, dry and intact Extremities: extremities normal, atraumatic, no cyanosis or edema Vaginal Bleeding: none  Assessment: s/p TLH, bilateral salpingectomy, cystoscopy  Plan: Recheck in 1 year Letter for return to work given today.

## 2016-01-30 ENCOUNTER — Telehealth: Payer: Self-pay | Admitting: *Deleted

## 2016-01-30 NOTE — Telephone Encounter (Signed)
"  We received a request for authorization of surgery.  We need to review clinicals.  Could you please fax that to [place]?"  I will send it over.  I faxed clinicals to [facility] from [facility] for review.

## 2016-01-31 NOTE — Telephone Encounter (Signed)
Surgery, scheduled for 02/14/2016,was authorized by [employer] on behalf of [facility].  Authorization number is [id number].  I faxed authorization to Caren Griffins at [facility].

## 2016-02-03 ENCOUNTER — Telehealth: Payer: Self-pay | Admitting: *Deleted

## 2016-02-03 NOTE — Telephone Encounter (Signed)
Already spoke to brian.  Integra compression plate.

## 2016-02-03 NOTE — Telephone Encounter (Signed)
"  March 31, Dr. Milinda Pointer has her scheduled for a Lapidus, nothing was requested as far as plates.  I need to know what type of plate to order.  Order needs to be placed so we will have it on time.  Give me a call."

## 2016-02-13 ENCOUNTER — Other Ambulatory Visit: Payer: Self-pay | Admitting: Podiatry

## 2016-02-13 MED ORDER — PROMETHAZINE HCL 25 MG PO TABS: 25.0000 mg | ORAL_TABLET | Freq: Three times a day (TID) | ORAL | Status: DC | PRN

## 2016-02-13 MED ORDER — OXYCODONE-ACETAMINOPHEN 10-325 MG PO TABS: ORAL_TABLET | ORAL | Status: DC

## 2016-02-13 MED ORDER — CLINDAMYCIN HCL 150 MG PO CAPS: 150.0000 mg | ORAL_CAPSULE | Freq: Three times a day (TID) | ORAL | Status: DC

## 2016-02-14 ENCOUNTER — Encounter: Payer: Self-pay | Admitting: Podiatry

## 2016-02-14 DIAGNOSIS — M21549 Acquired clubfoot, unspecified foot: Secondary | ICD-10-CM | POA: Diagnosis not present

## 2016-02-14 DIAGNOSIS — M2041 Other hammer toe(s) (acquired), right foot: Secondary | ICD-10-CM | POA: Diagnosis not present

## 2016-02-14 DIAGNOSIS — M2021 Hallux rigidus, right foot: Secondary | ICD-10-CM | POA: Diagnosis not present

## 2016-02-15 ENCOUNTER — Encounter: Payer: Self-pay | Admitting: Podiatry

## 2016-02-15 NOTE — Progress Notes (Signed)
Patient ID: Kathleen Barron, female   DOB: [birth year], 40 y.o.   MRN: [id number]  Patient called the call service stating her cast was tight and she was getting tingling to the inside aspect of her ankle. She has been icing and elevating with no relief. I met her (and her parents) at the office and I bi-valved the cast. Upon doing this, she had immediate resolution of symptoms. No further pain, pressure, and the tingling she was experiencing resolved. She had no calf pain with compression, swelling, warmth. She denies any systemic complaints such as fevers, chills, nausea, vomiting. Encouraged to call with any other issues. Follow-up a scheduled.

## 2016-02-17 ENCOUNTER — Telehealth: Payer: Self-pay | Admitting: *Deleted

## 2016-02-17 NOTE — Telephone Encounter (Signed)
Pt states she is feeling nauseous and dizzy, and like she can't sleep because her pain medication is putting her too deep into sleep.  Pt states she would like to take the catheter out and change her pain medication. I spoke with pt, she states she took the catheter out and she feels 90% better, she's taking 1/4 to 1/2 Phenergan.  I told pt to take a whole Phenergan and asked if she wanted to change her pain medication.  Pt states not at this time she feels if she takes 1 Oxycodone every 6 - 8 hours without the catheter she'll be fine.  I told pt to call with concerns.

## 2016-02-17 NOTE — Telephone Encounter (Signed)
ok 

## 2016-02-19 ENCOUNTER — Ambulatory Visit (INDEPENDENT_AMBULATORY_CARE_PROVIDER_SITE_OTHER): Payer: 59

## 2016-02-19 ENCOUNTER — Ambulatory Visit (INDEPENDENT_AMBULATORY_CARE_PROVIDER_SITE_OTHER): Payer: 59 | Admitting: Podiatry

## 2016-02-19 ENCOUNTER — Encounter: Payer: Self-pay | Admitting: Podiatry

## 2016-02-19 VITALS — BP 122/70 | HR 85 | Temp 98.2°F | Resp 16

## 2016-02-19 DIAGNOSIS — M2041 Other hammer toe(s) (acquired), right foot: Secondary | ICD-10-CM

## 2016-02-19 DIAGNOSIS — Z9889 Other specified postprocedural states: Secondary | ICD-10-CM | POA: Diagnosis not present

## 2016-02-19 DIAGNOSIS — M205X1 Other deformities of toe(s) (acquired), right foot: Secondary | ICD-10-CM

## 2016-02-19 NOTE — Progress Notes (Signed)
She presents today for her first postop visit status post Lapidus procedure right foot. She states that over the weekend she had come in and get the cast cut because it was just too tight. She also removed her catheter which seem to be causing her more pain than relief.  Objective: Vital signs are stable alert and oriented 3. She is able to wiggle her toes and has nearly full sensation to the plantar aspect of the toes. Radiographs confirm Lapidus procedure second metatarsal osteotomy and hammertoe repair fourth. Internal fixation appears to be in good position.  Assessment: Status post Lapidus procedure second metatarsal osteotomy and hammertoe repair fourth right or with internal fixation and cast.  Plan: Follow up with Dr. Jacqualyn Posey in 1 week for cast change and to remove any of the sutures possible.

## 2016-02-27 ENCOUNTER — Ambulatory Visit (INDEPENDENT_AMBULATORY_CARE_PROVIDER_SITE_OTHER): Payer: 59 | Admitting: Podiatry

## 2016-02-27 ENCOUNTER — Ambulatory Visit (INDEPENDENT_AMBULATORY_CARE_PROVIDER_SITE_OTHER): Payer: 59

## 2016-02-27 ENCOUNTER — Encounter: Payer: Self-pay | Admitting: Podiatry

## 2016-02-27 VITALS — BP 117/62 | HR 97 | Resp 18

## 2016-02-27 DIAGNOSIS — M205X1 Other deformities of toe(s) (acquired), right foot: Secondary | ICD-10-CM

## 2016-02-27 DIAGNOSIS — Z9889 Other specified postprocedural states: Secondary | ICD-10-CM

## 2016-02-27 DIAGNOSIS — M2041 Other hammer toe(s) (acquired), right foot: Secondary | ICD-10-CM

## 2016-02-27 NOTE — Patient Instructions (Signed)
Venous Thromboembolism Prevention Venous thromboembolism (VTE) is a condition in which a blood clot (thrombus) develops in the body. A thrombus usually occurs in a deep vein in the leg or the pelvis (DVT), but it can also occur in the arm. Sometimes, pieces of a thrombus can break off from its original place of development and travel through the bloodstream to other parts of the body. When that happens, the thrombus is called an embolus. An embolus that travels to one or both lungs is called a pulmonary embolism. An embolism can block the blood flow in the blood vessels of other organs as well. VTE is a serious health condition that can cause disability or death. It is very important to get help right away and to not ignore symptoms. HOW CAN A VTE BE PREVENTED?  Exercise regularly. Take a brisk 30 minute walk every day. Staying active and moving around can help you to prevent blood clots.  Avoid sitting or lying in bed for long periods of time without moving your legs. Change your position often, especially during long-distance travel (over 4 hours).  If you are a woman who is over 41 years of age, avoid unnecessary use of medicines that contain estrogen. These include birth control pills and hormone replacement therapy.  Do not smoke, especially if you take estrogen medicines. If you need help quitting, ask your health care provider.  Eat plenty of fruits and vegetables. Ask your health care provider or dietitian if there are foods that you should avoid.  Maintain a weight that is appropriate for your height. Ask your health care provider what weight is healthy for you.  Wear loose-fitting clothing. Avoid constrictive or tight clothing around your legs or waist.  Try not to bump or injure your legs. Avoid crossing your legs when you are sitting.  Do not use pillows under your knees while lying down unless told by your health care provider.  Wear support hose (compression stockings or TED  hose) as told by your health care provider Compression stockings increase blood flow in your legs and can help prevent blood clots. Do not let them bunch up when you are wearing them. HOW CAN I PREVENT VTE WHEN I TRAVEL? Long-distance travel (over 4 hours) can increase the risk of a VTE. To prevent VTE when traveling:  Exercise your legs every hour by standing, stretching, and bending and straightening your legs. If you are traveling by airplane, train, or bus, walk up and down the aisle as often as possible to get your blood moving. If you are traveling by car, stop and get out of the car every hour to exercise your legs and stretch. Other types of exercise might include:  Keeping your feet flat on the ground and raising your toes.  Switching from tightening the muscles in your calves and thighs to relaxing those same muscles while you are sitting.  Pointing and flexing your feet at the ankle joints while you are sitting.  Stay well hydrated while traveling. Drink enough water to keep your urine clear or pale yellow.  Avoid drinking alcohol during long travel. Generally, it is not recommended that you take medicines to prevent DVT during routine travel. HOW CAN VTE BE PREVENTED IF I AM HOSPITALIZED? A VTE may be prevented by taking medicines that are prescribed to prevent blood clots (anticoagulants). You can also help to prevent VTE while in the hospital by taking these actions:  Get out of bed and walk. Ask your health care provider  if this is safe for you to do.  Request the use of a sequential compression device (SCD). This is a machine that pumps air into compression sleeves that are wrapped around your legs.  Request the use of compression stockings, which are tight, elastic stockings that apply pressure to the lower legs. Compression stockings are sometimes used with SCDs. HOW CAN I PREVENT VTE AFTER SURGERY? Understand that there is an increased risk for VTE for the first 4-6 weeks  after surgery. During this time:  Avoid long-distance travel (over 4 hours). If you must travel during this time, ask your health care provider about additional preventive actions that you can take. These might include exercising your arms and legs every hour while you travel.  Avoid sitting or lying still for too long. If possible, get up and walk around one time every hour. Ask your health care provider when this is safe for you to do. SEEK IMMEDIATE MEDICAL CARE IF:  You have new or increased pain, swelling, or redness in an arm or leg.  You have numbness or tingling in an arm or leg.  You have shortness of breath while active or at rest.  You have chest pain.  You have a rapid or irregular heartbeat.  You feel light-headed or dizzy.  You cough up blood.  You notice blood in your vomit, bowel movement, or urine. These symptoms may represent a serious problem that is an emergency. Do not wait to see if the symptoms will go away. Get medical help right away. Call your local emergency services (911 in the [place] Do not drive yourself to the hospital.   This information is not intended to replace advice given to you by your health care provider. Make sure you discuss any questions you have with your health care provider.   Document Released: 10/21/2009 Document Revised: 07/24/2015 Document Reviewed: 02/27/2015 Elsevier Interactive Patient Education 2016 [place] or Splint Care Casts and splints support injured limbs and keep bones from moving while they heal. It is important to care for your cast or splint at home.  HOME CARE INSTRUCTIONS  Keep the cast or splint uncovered during the drying period. It can take 24 to 48 hours to dry if it is made of plaster. A fiberglass cast will dry in less than 1 hour.  Do not rest the cast on anything harder than a pillow for the first 24 hours.  Do not put weight on your injured limb or apply pressure to the cast until your health  care provider gives you permission.  Keep the cast or splint dry. Wet casts or splints can lose their shape and may not support the limb as well. A wet cast that has lost its shape can also create harmful pressure on your skin when it dries. Also, wet skin can become infected.  Cover the cast or splint with a plastic bag when bathing or when out in the rain or snow. If the cast is on the trunk of the body, take sponge baths until the cast is removed.  If your cast does become wet, dry it with a towel or a blow dryer on the cool setting only.  Keep your cast or splint clean. Soiled casts may be wiped with a moistened cloth.  Do not place any hard or soft foreign objects under your cast or splint, such as cotton, toilet paper, lotion, or powder.  Do not try to scratch the skin under the cast with any object.  The object could get stuck inside the cast. Also, scratching could lead to an infection. If itching is a problem, use a blow dryer on a cool setting to relieve discomfort.  Do not trim or cut your cast or remove padding from inside of it.  Exercise all joints next to the injury that are not immobilized by the cast or splint. For example, if you have a long leg cast, exercise the hip joint and toes. If you have an arm cast or splint, exercise the shoulder, elbow, thumb, and fingers.  Elevate your injured arm or leg on 1 or 2 pillows for the first 1 to 3 days to decrease swelling and pain.It is best if you can comfortably elevate your cast so it is higher than your heart. SEEK MEDICAL CARE IF:   Your cast or splint cracks.  Your cast or splint is too tight or too loose.  You have unbearable itching inside the cast.  Your cast becomes wet or develops a soft spot or area.  You have a bad smell coming from inside your cast.  You get an object stuck under your cast.  Your skin around the cast becomes red or raw.  You have new pain or worsening pain after the cast has been  applied. SEEK IMMEDIATE MEDICAL CARE IF:   You have fluid leaking through the cast.  You are unable to move your fingers or toes.  You have discolored (blue or white), cool, painful, or very swollen fingers or toes beyond the cast.  You have tingling or numbness around the injured area.  You have severe pain or pressure under the cast.  You have any difficulty with your breathing or have shortness of breath.  You have chest pain.   This information is not intended to replace advice given to you by your health care provider. Make sure you discuss any questions you have with your health care provider.   Document Released: 10/30/2000 Document Revised: 08/23/2013 Document Reviewed: 05/11/2013 Elsevier Interactive Patient Education [employer].

## 2016-02-28 DIAGNOSIS — M2040 Other hammer toe(s) (acquired), unspecified foot: Secondary | ICD-10-CM | POA: Insufficient documentation

## 2016-02-28 DIAGNOSIS — Z9889 Other specified postprocedural states: Secondary | ICD-10-CM | POA: Insufficient documentation

## 2016-02-28 DIAGNOSIS — M205X9 Other deformities of toe(s) (acquired), unspecified foot: Secondary | ICD-10-CM | POA: Insufficient documentation

## 2016-02-28 NOTE — Progress Notes (Signed)
Patient ID: Kathleen Barron, female   DOB: [birth year], 40 y.o.   MRN: [id number]  Subjective: Kathleen Barron is a 40 y.o. is seen today in office s/p right Lapdius, 4th hammertoe repair.They state their pain is much improved. She has continue nonweightbearing. She did fall over this weekend in her toes but denies any significant increase in pain. Denies any systemic complaints such as fevers, chills, nausea, vomiting. No calf pain, chest pain, shortness of breath.   Objective: General: No acute distress, AAOx3  DP/PT pulses palpable 2/4, CRT < 3 sec to all digits.  Protective sensation intact. Motor function intact.  Right foot: Incision is well coapted without any evidence of dehiscence and sutures intact. There is no surrounding erythema, ascending cellulitis, fluctuance, crepitus, malodor, drainage/purulence. There is minimal edema around the surgical site. There is no pain along the surgical site. Toe sitting in a rectus position. There is no tenderness the toes from where she injured them. There is no edema or bruising. No other areas of tenderness to bilateral lower extremities.  No other open lesions or pre-ulcerative lesions.  No pain with calf compression, swelling, warmth, erythema.   Assessment and Plan:  Status post right foot surgery, doing well with no complications   -Treatment options discussed including all alternatives, risks, and complications -X-rays were obtained and reviewed with the patient. Hardware intact without any evidence of breakage or loosening. No evidence of acute fracture. -At today's appointment a cast was changed. A below-knee well-padded fiberglass cast was applied making sure to pad all bony prominences. -Sutures removed today. Antibiotic ointment was applied followed by dry sterile dressing. -Aspirin daily while nonweightbearing. -Ice/elevation -Pain medication as needed. -Monitor for any clinical signs or symptoms of infection and DVT/PE and  directed to call the office immediately should any occur or go to the ER. -Follow-up in 2 weeks with Dr. Milinda Pointer or sooner if any problems arise. In the meantime, encouraged to call the office with any questions, concerns, change in symptoms.   Kathleen Barron, DPM

## 2016-03-02 ENCOUNTER — Encounter: Payer: Self-pay | Admitting: Podiatry

## 2016-03-02 ENCOUNTER — Ambulatory Visit (INDEPENDENT_AMBULATORY_CARE_PROVIDER_SITE_OTHER): Payer: 59 | Admitting: Podiatry

## 2016-03-02 DIAGNOSIS — Z9889 Other specified postprocedural states: Secondary | ICD-10-CM

## 2016-03-02 NOTE — Progress Notes (Signed)
She presents today status post Lapidus. She states that the cast is causing her toes to be numb and causing circulation changes. She denies any chest pain shortness of breath. States that the pain is minimal at this point.  Objective: Vital signs are stable she is alert and oriented 3 cast is intact toes appear to be of normal color.  Assessment: Status post Lapidus procedure type cast right.  Plan: We bivalve the cast today and will follow-up with her at her regular schedule.

## 2016-03-05 ENCOUNTER — Telehealth: Payer: Self-pay | Admitting: *Deleted

## 2016-03-05 NOTE — Telephone Encounter (Signed)
Per Dr. Milinda Pointer-  She needs to keep the cast on with ace wraps. These sensations she's having is is normal.

## 2016-03-05 NOTE — Telephone Encounter (Addendum)
Pt states the cast was split and she was to cover the split cast with an ace wrap. Pt states the cast is too tight with the ace wrapped, and toes become painful, and without the wrap the cast shifts too much. I told pt not to wrap the ace so tight around the cast, to decrease being up and around because the foot and leg are going to swell if below the heart even if surgery foot isn't touching the ground.  Pt states she isn't up on it that much and has adjusted the ace multiple times without good results. Pt asked if she should not use the ace and also if she was getting the therapeutic benefit of the cast, if she didn't use the ace. I told pt I would call again with advice.  03/05/2016-Pt called again and asked the status of the answer to her question this morning, and she forgot to mention that when she stands and her toes become purple and painfully numb and she has burning at the incision site and the arch.  Informed pt of Dr. Stephenie Acres orders and statement concerning her post op sensations.

## 2016-03-10 NOTE — Progress Notes (Signed)
DOS 02/14/2016 Lapidus Procedure with Bunion repair right foot with plates/screws, 2nd metatarsal osteotomy with screw right, hammer toe repair with screw 4th toe right foot, with application of below the knee cast.

## 2016-03-16 ENCOUNTER — Encounter: Payer: Self-pay | Admitting: Podiatry

## 2016-03-16 ENCOUNTER — Ambulatory Visit (INDEPENDENT_AMBULATORY_CARE_PROVIDER_SITE_OTHER): Payer: 59 | Admitting: Podiatry

## 2016-03-16 ENCOUNTER — Ambulatory Visit (INDEPENDENT_AMBULATORY_CARE_PROVIDER_SITE_OTHER): Payer: 59

## 2016-03-16 VITALS — BP 118/75 | HR 92 | Resp 16

## 2016-03-16 DIAGNOSIS — M205X1 Other deformities of toe(s) (acquired), right foot: Secondary | ICD-10-CM | POA: Diagnosis not present

## 2016-03-16 DIAGNOSIS — M2041 Other hammer toe(s) (acquired), right foot: Secondary | ICD-10-CM

## 2016-03-16 DIAGNOSIS — Z9889 Other specified postprocedural states: Secondary | ICD-10-CM | POA: Diagnosis not present

## 2016-03-16 DIAGNOSIS — M359 Systemic involvement of connective tissue, unspecified: Secondary | ICD-10-CM | POA: Insufficient documentation

## 2016-03-16 NOTE — Progress Notes (Signed)
She presents today for surgical follow-up right foot. Date of surgery 02/14/2016 Lapidus procedure second metatarsal osteotomy and hammertoe repair fourth all of the right foot. She presents today in a bivalved cast.  Objective: Vital signs are stable alert and oriented 3. Pulses are palpable. Cast was removed demonstrate mild edema no saline as drainage or odor. Sutures are intact or so well coapted. No signs of infection. She has good range of motion of the first metatarsophalangeal joint.  Assessment well-healing surgical foot status post 1 month right foot.  Plan: Demonstrate for her today how to apply an Ace bandage and a small wrap to the fourth toe. The nurses demonstrated the use of a Cam Walker in a nonweightbearing fashion and its application. She will continue nonweightbearing I will allow her to wash the foot but I will see her in 1 month. I encouraged range of motion exercises.

## 2016-04-15 ENCOUNTER — Encounter: Payer: Self-pay | Admitting: Podiatry

## 2016-04-15 ENCOUNTER — Ambulatory Visit (INDEPENDENT_AMBULATORY_CARE_PROVIDER_SITE_OTHER): Payer: 59

## 2016-04-15 ENCOUNTER — Ambulatory Visit (INDEPENDENT_AMBULATORY_CARE_PROVIDER_SITE_OTHER): Payer: 59 | Admitting: Podiatry

## 2016-04-15 VITALS — BP 127/80 | HR 77 | Resp 18

## 2016-04-15 DIAGNOSIS — M205X1 Other deformities of toe(s) (acquired), right foot: Secondary | ICD-10-CM

## 2016-04-15 NOTE — Progress Notes (Signed)
She presents today for follow-up of her right foot date of surgery is 02/14/2016 with a Lapidus procedure the second metatarsal osteotomy. She states that she is doing quite well and she has not started putting much weight on the foot as of yet. She denies chest pain or shortness of breath. She denies calf pain.  Objective: Vital signs are stable alert and oriented 3. Pulses are palpable. Neurologic sensorium is intact. Deep tendon reflexes are intact. Muscle strength is normal bilateral. She's good range of motion of the first metatarsophalangeal joint though it is tight I do believe we Physical therapy at this point. Radiographs do demonstrate well healing surgical foot with Lapidus procedure.  Assessment: With surgical foot Lapidus procedure second metatarsal osteotomy hammertoe repair fourth right.  Plan: We are going to request physical therapy at this point we'll follow up with her in 1 month. I encouraged her to move through partial weightbearing over the next 2 weeks extending to full weightbearing by 1 month.

## 2016-04-29 DIAGNOSIS — M79673 Pain in unspecified foot: Secondary | ICD-10-CM

## 2016-05-11 ENCOUNTER — Telehealth: Payer: Self-pay | Admitting: *Deleted

## 2016-05-11 NOTE — Telephone Encounter (Signed)
Patient was in 08 recall for 12/2015 ( ASCUS HPV HR) . She has a hysterectomy 11/2015. Please advise on recall status Thanks Margaretha Sheffield

## 2016-05-12 NOTE — Telephone Encounter (Signed)
Ok to remove from recall.  Thanks. 

## 2016-05-13 ENCOUNTER — Ambulatory Visit (INDEPENDENT_AMBULATORY_CARE_PROVIDER_SITE_OTHER): Payer: 59 | Admitting: Podiatry

## 2016-05-13 ENCOUNTER — Ambulatory Visit (INDEPENDENT_AMBULATORY_CARE_PROVIDER_SITE_OTHER): Payer: 59

## 2016-05-13 ENCOUNTER — Encounter: Payer: Self-pay | Admitting: Podiatry

## 2016-05-13 DIAGNOSIS — Z9889 Other specified postprocedural states: Secondary | ICD-10-CM

## 2016-05-13 DIAGNOSIS — M205X1 Other deformities of toe(s) (acquired), right foot: Secondary | ICD-10-CM

## 2016-05-13 DIAGNOSIS — M2041 Other hammer toe(s) (acquired), right foot: Secondary | ICD-10-CM

## 2016-05-14 NOTE — Progress Notes (Signed)
She presents today status post Lapidus procedure 02/14/2016 she states this seems to be doing well she presents with her cam walker.  Objective: Vital signs are stable she is alert and oriented 3 no erythema edema cellulitis drainage or odor. She has good range of motion of the second and fourth digits of the right foot as well as of the first metatarsophalangeal joint which does demonstrate some limitation on range of motion with symmetrical eyes. Radiographs do demonstrate the localized but he goes on to show that there is complete fusion of the  first metatarsophalangeal joint.  Assessment: Well-healed surgical foot left.  Plan: I encouraged her to continue range of motion exercises and increase her walking utilizing a regular shoe I expressed to her that she would only have to wear a surgical shoe before getting into a regular shoe but she is okay doing. I will follow-up with her in 6 weeks

## 2016-06-24 ENCOUNTER — Ambulatory Visit: Payer: Self-pay

## 2016-06-24 ENCOUNTER — Ambulatory Visit (INDEPENDENT_AMBULATORY_CARE_PROVIDER_SITE_OTHER): Payer: No Typology Code available for payment source | Admitting: Podiatry

## 2016-06-24 ENCOUNTER — Encounter: Payer: Self-pay | Admitting: Podiatry

## 2016-06-24 DIAGNOSIS — M205X1 Other deformities of toe(s) (acquired), right foot: Secondary | ICD-10-CM

## 2016-06-24 DIAGNOSIS — M2041 Other hammer toe(s) (acquired), right foot: Secondary | ICD-10-CM

## 2016-06-24 DIAGNOSIS — Z9889 Other specified postprocedural states: Secondary | ICD-10-CM

## 2016-06-24 NOTE — Progress Notes (Signed)
She presents today for her final follow-up visit status post Lapidus procedure bunion repair right foot. She denies fever chills nausea vomiting muscle exposed and states that her foot feels great.  Objective: Vital signs are stable she is alert and oriented 3. Pulses are strongly palpable. Neurologic sensorium is intact. She has great range of motion of the first metatarsophalangeal joint and she also has minimal edema no pain. Radiographs and straight well-healed osteotomies and fusion sites.  Assessment: Well-healing surgical foot right.  Plan: Follow up with me as needed.

## 2016-12-22 IMAGING — MR MR FOOT*R* W/O CM
6 series · 40 of 40 positions shown · non-contrast
Comparison: Office radiographs 10/17/2014 and 10/21/2015.

CLINICAL DATA: Constant metatarsal pain. History of fracture.
Evaluate degenerative arthritis at the first metatarsal/ cuneiform
joint with nerve entrapment.

EXAM:
MRI OF THE RIGHT FOREFOOT WITHOUT CONTRAST
TECHNIQUE: Multiplanar, multisequence MR imaging was performed. No intravenous
contrast was administered.

[Series 4: PD fat-sat · axial · 3.0mm · 0.62mm/px · z∈[-108,+15]mm · 6 of 38 slices shown]
[im 1/38]
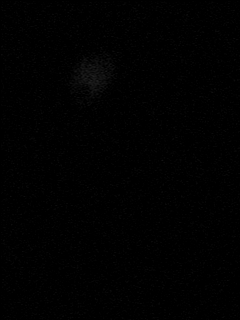
[im 8/38]
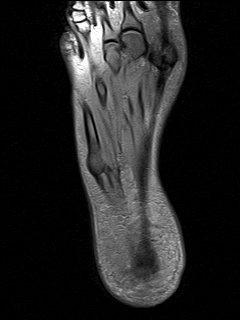
[im 15/38]
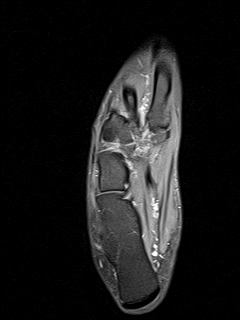
[im 23/38]
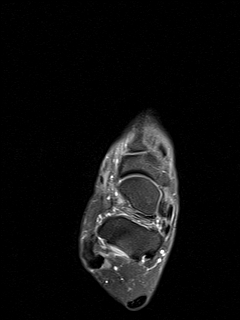
[im 30/38]
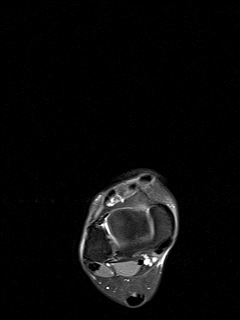
[im 38/38]
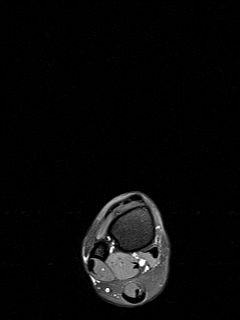

[Series 5: T2 fat-sat · axial · 3.0mm · 0.62mm/px · z∈[-108,+15]mm · 7 of 38 slices shown (1 of 3)]
[im 1/38]
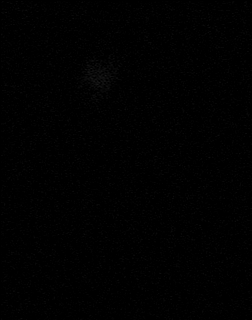
[im 7/38]
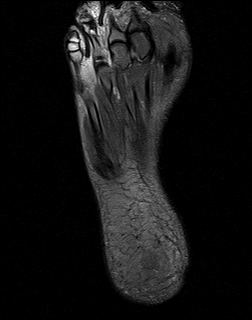
[im 13/38]
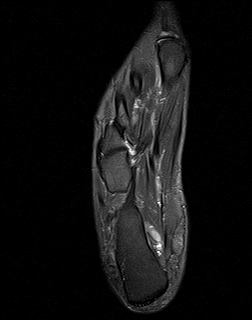
[im 19/38]
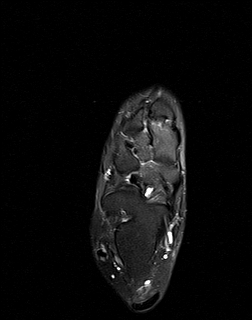
[im 25/38]
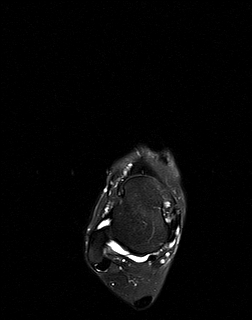
[im 31/38]
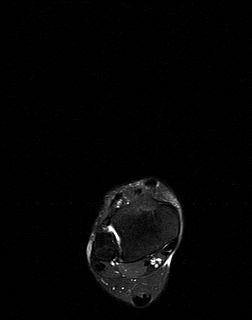
[im 38/38]
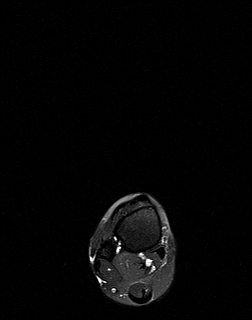

[Series 6: STIR · axial · 3.0mm · 0.78mm/px · z∈[-108,+14]mm · 7 of 38 slices shown]
[im 1/38]
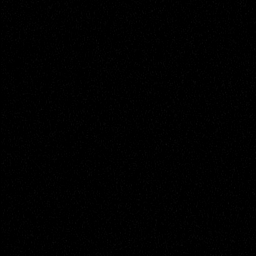
[im 7/38]
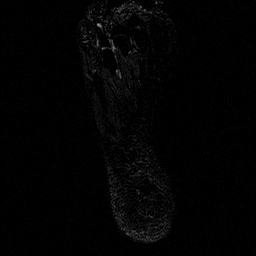
[im 13/38]
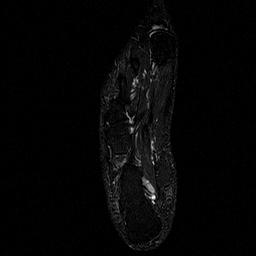
[im 19/38]
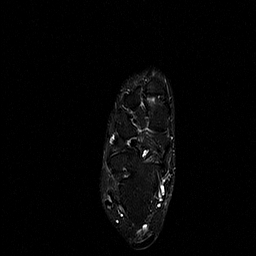
[im 25/38]
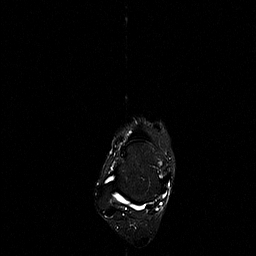
[im 31/38]
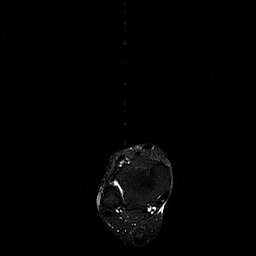
[im 38/38]
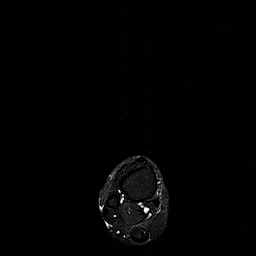

[Series 7: T2 fat-sat · coronal · 3.0mm · 0.56mm/px · 10 of 57 slices shown (2 of 3)]
[im 1/57]
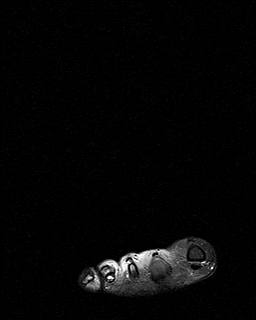
[im 7/57]
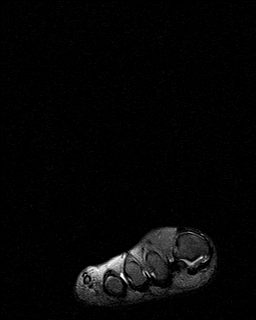
[im 13/57]
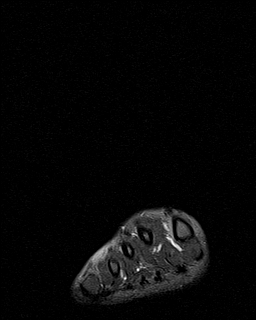
[im 19/57]
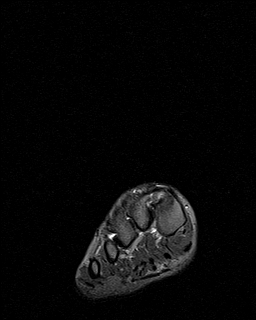
[im 25/57]
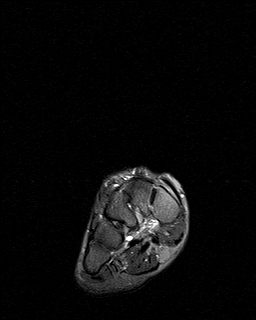
[im 32/57]
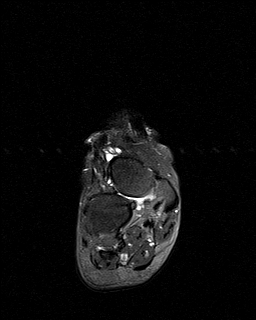
[im 38/57]
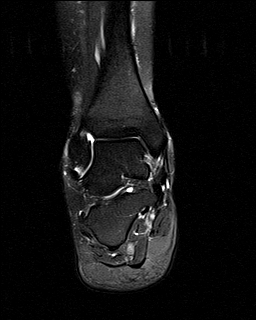
[im 44/57]
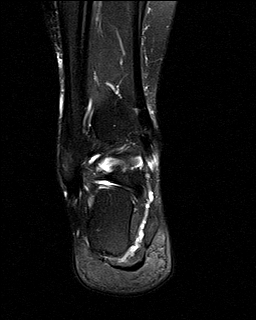
[im 50/57]
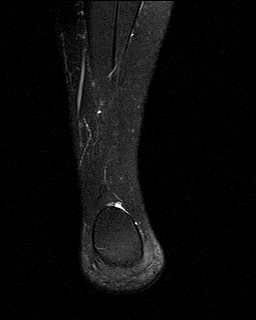
[im 57/57]
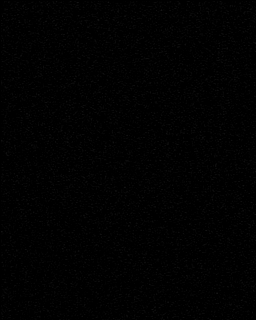

[Series 8: T1 · sagittal · 3.0mm · 0.52mm/px · 5 of 28 slices shown]
[im 1/28]
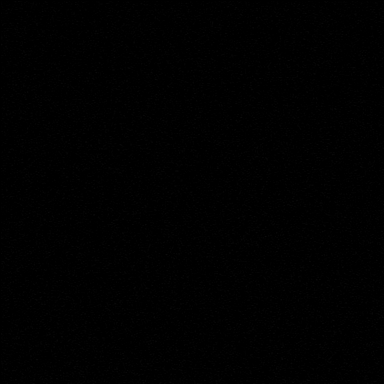
[im 7/28]
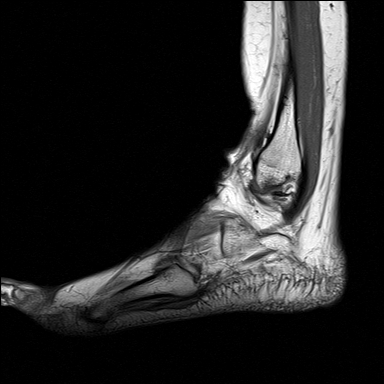
[im 14/28]
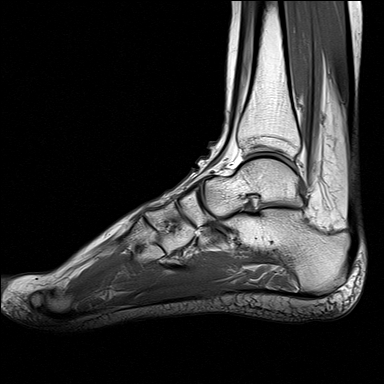
[im 21/28]
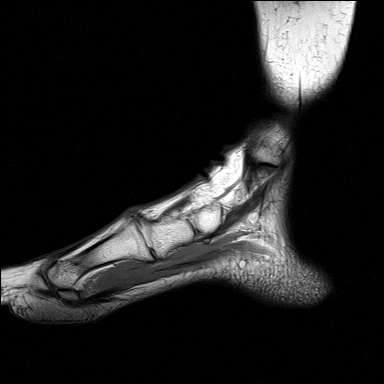
[im 28/28]
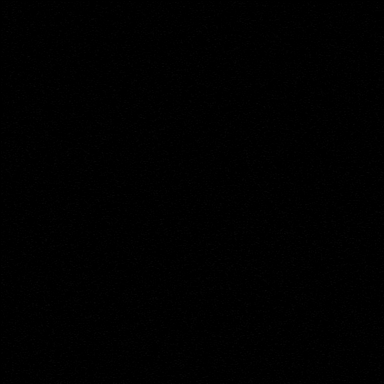

[Series 9: T2 fat-sat · sagittal · 3.0mm · 0.62mm/px · 5 of 28 slices shown (3 of 3)]
[im 1/28]
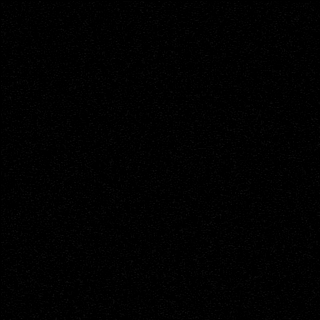
[im 7/28]
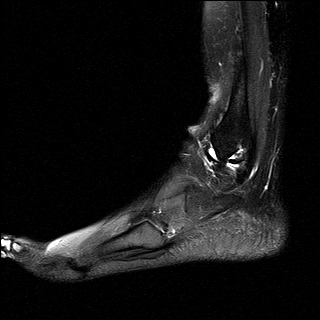
[im 14/28]
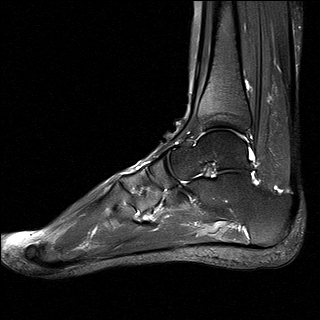
[im 21/28]
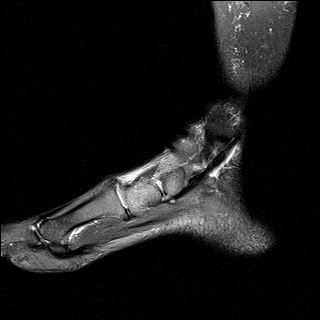
[im 28/28]
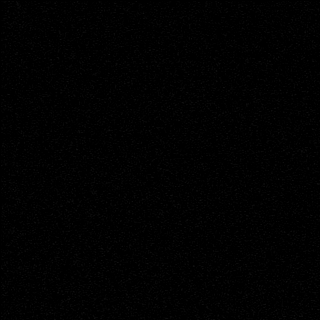

[40 of 40 positions shown; findings below may reference images not displayed]

FINDINGS: Most of the foot, with the exception of the distal toes, is included
in the field-of-view. There is no evidence of acute fracture or
dislocation. The alignment is normal at the Lisfranc joint. There
are degenerative changes at the articulation between the first
metatarsal and the medial cuneiform with mild subchondral cyst
formation and dorsal osteophytes, grossly stable from previous
radiographs. No adjacent ganglion, enlarged nerve or other soft
tissue abnormality identified.

There are no other significant arthropathic changes. The ankle
tendons and ligaments appear normal. There is no muscular edema to
suggest denervation. No web space or tarsal tunnel abnormalities
identified.
IMPRESSION: 1. Degenerative changes at the first tarsal metatarsal articulation
with dorsal osteophytes.
2. No definite signs of nerve entrapment or denervation in the
forefoot. Osteophytes at the first tarsal metatarsal joint may
contribute to encroachment on the sensory component of the deep
peroneal nerve. Correlate clinically.
3. No acute osseous findings. The ankle tendons and ligaments appear
intact.

## 2017-04-20 ENCOUNTER — Ambulatory Visit (INDEPENDENT_AMBULATORY_CARE_PROVIDER_SITE_OTHER): Payer: No Typology Code available for payment source | Admitting: Obstetrics & Gynecology

## 2017-04-20 ENCOUNTER — Encounter: Payer: Self-pay | Admitting: Obstetrics & Gynecology

## 2017-04-20 VITALS — BP 110/70 | HR 76 | Resp 14 | Ht 66.0 in | Wt 129.0 lb

## 2017-04-20 DIAGNOSIS — Z0000 Encounter for general adult medical examination without abnormal findings: Secondary | ICD-10-CM | POA: Diagnosis not present

## 2017-04-20 DIAGNOSIS — Z01419 Encounter for gynecological examination (general) (routine) without abnormal findings: Secondary | ICD-10-CM

## 2017-04-20 NOTE — Progress Notes (Signed)
41 y.o. G0P0000 DivorcedCaucasianF here for annual exam.  Doing well.  Had major foot surgery in March and was non-weight bearing for 2 1/2 months.  Was in a boot for over 3 months.  Does have some mild pain and swelling in the foot.  Denies vaginal bleeding.  States she is so very happy that she had the hysterectomy early last year.  States her recovery was easy, esp compared to her foot surgery recovery.  States she has just felt great--no pelvic pressure, cramping, bleeding.  And reports she feels good being off hormones as well.    Took a new job in [place].  Parents moved from [place].  Really happy with all of these changes.    Patient's last menstrual period was 10/03/2015.          Sexually active: No.  The current method of family planning is status post hysterectomy.    Exercising: Yes.    walking Smoker:  no  Health Maintenance: Pap:  12/20/14 ASCUS. HR HPV:neg  History of abnormal Pap:  yes MMG:  Never TDaP:  07/2016  Pneumonia vaccine(s):  N/A Zostavax:   N/A Hep C testing: N/A Screening Labs: here    reports that she has never smoked. She has never used smokeless tobacco. She reports that she drinks alcohol. She reports that she does not use drugs.  Past Medical History:  Diagnosis Date  . Anemia    as a teenager   . Arthritis    osteoartritis and rheumatoid   . Discoid lupus   . Genital warts   . Lactose intolerance   . Migraines    no aura    Past Surgical History:  Procedure Laterality Date  . BILATERAL SALPINGECTOMY Bilateral 12/02/2015   Procedure: BILATERAL SALPINGECTOMY;  Surgeon: Megan Salon, MD;  Location: [facility];  Service: Gynecology;  Laterality: Bilateral;  . CYSTOSCOPY N/A 12/02/2015   Procedure: CYSTOSCOPY;  Surgeon: Megan Salon, MD;  Location: [facility];  Service: Gynecology;  Laterality: N/A;  . LAPAROSCOPIC HYSTERECTOMY N/A 12/02/2015   Procedure: HYSTERECTOMY TOTAL LAPAROSCOPIC;  Surgeon: Megan Salon, MD;  Location: [facility];  Service:  Gynecology;  Laterality: N/A;  . tear duct surgery     infant    Current Outpatient Prescriptions  Medication Sig Dispense Refill  . b complex vitamins tablet Take 1 tablet by mouth daily. Reported on 12/09/2015    . Biotin 1 MG CAPS Take 1 tablet by mouth daily. Reported on 12/09/2015    . Multiple Vitamins-Minerals (MULTIVITAMIN PO) Take 1 tablet by mouth daily. Reported on 12/09/2015    . Vitamin D, Cholecalciferol, 1000 UNITS CAPS Take 1 capsule by mouth daily. Reported on 12/09/2015     No current facility-administered medications for this visit.     Family History  Problem Relation Age of Onset  . Lupus Mother   . Hypertension Mother   . Hypertension Maternal Grandmother   . Hypertension Maternal Grandfather   . Cancer Maternal Grandfather        prostate    ROS:  Pertinent items are noted in HPI.  Otherwise, a comprehensive ROS was negative.  Exam:   BP 110/70 (BP Location: Right Arm, Patient Position: Sitting, Cuff Size: Normal)   Pulse 76   Resp 14   Ht 5\' 6"  (1.676 m)   Wt 129 lb (58.5 kg)   LMP 10/03/2015   BMI 20.82 kg/m   Weight change:  Stable  Height: 5\' 6"  (167.6 cm)  Ht  Readings from Last 3 Encounters:  04/20/17 5\' 6"  (1.676 m)  01/02/16 5\' 6"  (1.676 m)  12/09/15 5\' 6"  (1.676 m)    General appearance: alert, cooperative and appears stated age Head: Normocephalic, without obvious abnormality, atraumatic Neck: no adenopathy, supple, symmetrical, trachea midline and thyroid normal to inspection and palpation Lungs: clear to auscultation bilaterally Breasts: normal appearance, no masses or tenderness Heart: regular rate and rhythm Abdomen: soft, non-tender; bowel sounds normal; no masses,  no organomegaly Extremities: extremities normal, atraumatic, no cyanosis or edema Skin: Skin color, texture, turgor normal. No rashes or lesions Lymph nodes: Cervical, supraclavicular, and axillary nodes normal. No abnormal inguinal nodes palpated Neurologic: Grossly  normal   Pelvic: External genitalia:  no lesions              Urethra:  normal appearing urethra with no masses, tenderness or lesions              Bartholins and Skenes: normal                 Vagina: normal appearing vagina with normal color and discharge, no lesions              Cervix: absent              Pap taken: No. Bimanual Exam:  Uterus:  uterus absent              Adnexa: no mass, fullness, tenderness               Rectovaginal: Confirms               Anus:  normal sphincter tone, no lesions  Chaperone was present for exam.  A:  Well Woman with normal exam S/p TLH/bilateral salpingectomy/cystoscopy due to multiple fibroids H/O discoid lupus  P:   Mammogram guidelines reviewed.  Order given for doing in the [place] area pap smear not indicated CBC, CMP, lipids, TSH, Vit D obtained today Names given for ob/gyn close to where she currently lives return annually or prn

## 2017-04-20 NOTE — Patient Instructions (Addendum)
Dr. Verne C[facility] [address] [place], [place] [address]  Tel:: [phone] Fax: [phone]

## 2017-04-21 LAB — COMPREHENSIVE METABOLIC PANEL
ALBUMIN: 4.7 g/dL (ref 3.5–5.5)
ALK PHOS: 64 IU/L (ref 39–117)
ALT: 23 IU/L (ref 0–32)
AST: 19 IU/L (ref 0–40)
Albumin/Globulin Ratio: 1.9 (ref 1.2–2.2)
BILIRUBIN TOTAL: 1 mg/dL (ref 0.0–1.2)
BUN/Creatinine Ratio: 17 (ref 9–23)
BUN: 13 mg/dL (ref 6–24)
CHLORIDE: 102 mmol/L (ref 96–106)
CO2: 27 mmol/L (ref 18–29)
Calcium: 10 mg/dL (ref 8.7–10.2)
Creatinine, Ser: 0.77 mg/dL (ref 0.57–1.00)
GFR calc Af Amer: 111 mL/min/{1.73_m2} (ref >59)
GFR calc non Af Amer: 96 mL/min/{1.73_m2} (ref >59)
GLOBULIN, TOTAL: 2.5 g/dL (ref 1.5–4.5)
Glucose: 90 mg/dL (ref 65–99)
Potassium: 4.4 mmol/L (ref 3.5–5.2)
SODIUM: 143 mmol/L (ref 134–144)
Total Protein: 7.2 g/dL (ref 6.0–8.5)

## 2017-04-21 LAB — CBC
HEMOGLOBIN: 12.4 g/dL (ref 11.1–15.9)
Hematocrit: 38.6 % (ref 34.0–46.6)
MCH: 30.9 pg (ref 26.6–33.0)
MCHC: 32.1 g/dL (ref 31.5–35.7)
MCV: 96 fL (ref 79–97)
Platelets: 265 10*3/uL (ref 150–379)
RBC: 4.01 x10E6/uL (ref 3.77–5.28)
RDW: 12.8 % (ref 12.3–15.4)
WBC: 8.3 10*3/uL (ref 3.4–10.8)

## 2017-04-21 LAB — LIPID PANEL
CHOL/HDL RATIO: 3.2 ratio (ref 0.0–4.4)
Cholesterol, Total: 229 mg/dL — ABNORMAL HIGH (ref 100–199)
HDL: 72 mg/dL (ref >39)
LDL CALC: 129 mg/dL — AB (ref 0–99)
TRIGLYCERIDES: 142 mg/dL (ref 0–149)
VLDL Cholesterol Cal: 28 mg/dL (ref 5–40)

## 2017-04-21 LAB — VITAMIN D 25 HYDROXY (VIT D DEFICIENCY, FRACTURES): Vit D, 25-Hydroxy: 33.3 ng/mL (ref 30.0–100.0)

## 2017-04-21 LAB — TSH: TSH: 2.88 u[IU]/mL (ref 0.450–4.500)

## 2017-07-30 ENCOUNTER — Encounter: Payer: Self-pay | Admitting: Obstetrics & Gynecology

## 2017-07-30 DIAGNOSIS — E785 Hyperlipidemia, unspecified: Secondary | ICD-10-CM | POA: Insufficient documentation

## 2018-07-05 ENCOUNTER — Encounter

## 2018-07-05 ENCOUNTER — Ambulatory Visit: Payer: No Typology Code available for payment source | Admitting: Obstetrics & Gynecology

## 2020-02-09 ENCOUNTER — Ambulatory Visit: Payer: PRIVATE HEALTH INSURANCE | Attending: Internal Medicine

## 2020-02-09 DIAGNOSIS — Z23 Encounter for immunization: Secondary | ICD-10-CM

## 2020-02-09 NOTE — Progress Notes (Signed)
   Covid-19 Vaccination Clinic  Name:  [name]    MRN: [id number] DOB: June 02, 1976  02/09/2020  [name] was observed post Covid-19 immunization for 15 minutes without incident. She was provided with Vaccine Information Sheet and instruction to access the V-Safe system.   [name] was instructed to call 911 with any severe reactions post vaccine: Marland Kitchen Difficulty breathing  . Swelling of face and throat  . A fast heartbeat  . A bad rash all over body  . Dizziness and weakness   Immunizations Administered    Name Date Dose VIS Date Route   Pfizer COVID-19 Vaccine 02/09/2020  3:21 PM 0.3 mL 10/27/2019 Intramuscular   Manufacturer: [place]   Lot: [id number]   [place]: [id number]

## 2020-03-05 ENCOUNTER — Ambulatory Visit: Payer: PRIVATE HEALTH INSURANCE | Attending: Internal Medicine

## 2020-03-05 DIAGNOSIS — Z23 Encounter for immunization: Secondary | ICD-10-CM

## 2020-03-05 NOTE — Progress Notes (Signed)
   Covid-19 Vaccination Clinic  Name:  Kathleen Barron    MRN: [id number] DOB: 02-28-1976  03/05/2020  Kathleen Barron was observed post Covid-19 immunization for 15 minutes without incident. She was provided with Vaccine Information Sheet and instruction to access the V-Safe system.   Kathleen Barron was instructed to call 911 with any severe reactions post vaccine: Marland Kitchen Difficulty breathing  . Swelling of face and throat  . A fast heartbeat  . A bad rash all over body  . Dizziness and weakness   Immunizations Administered    Name Date Dose VIS Date Route   Pfizer COVID-19 Vaccine 03/05/2020  3:00 PM 0.3 mL 01/10/2019 Intramuscular   Manufacturer: [place]   Lot: [id number]   [place]: [id number]
